# Patient Record
Sex: Male | Born: 1970 | Race: Black or African American | Hispanic: No | Marital: Married | Smoking: Never smoker
Health system: Southern US, Community
[De-identification: ages and names within clinical notes are randomized; demographics above are authoritative.]

## PROBLEM LIST (undated history)

## (undated) DIAGNOSIS — I10 Essential (primary) hypertension: Secondary | ICD-10-CM

---

## 2013-01-17 ENCOUNTER — Emergency Department: Admission: EM | Admit: 2013-01-17 | Discharge: 2013-01-17 | Payer: Self-pay | Source: Home / Self Care

## 2013-01-17 ENCOUNTER — Emergency Department
Admission: EM | Admit: 2013-01-17 | Discharge: 2013-01-17 | Disposition: A | Discharge status: Home / Self Care | Payer: PRIVATE HEALTH INSURANCE | Source: Home / Self Care | Attending: Emergency Medicine | Admitting: Emergency Medicine

## 2013-01-17 DIAGNOSIS — H01004 Unspecified blepharitis left upper eyelid: Secondary | ICD-10-CM

## 2013-01-17 DIAGNOSIS — H109 Unspecified conjunctivitis: Secondary | ICD-10-CM

## 2013-01-17 DIAGNOSIS — I10 Essential (primary) hypertension: Secondary | ICD-10-CM | POA: Insufficient documentation

## 2013-01-17 DIAGNOSIS — H01009 Unspecified blepharitis unspecified eye, unspecified eyelid: Secondary | ICD-10-CM

## 2013-01-17 HISTORY — DX: Essential (primary) hypertension: I10

## 2013-01-17 MED ORDER — POLYMYXIN B-TRIMETHOPRIM 10000-0.1 UNIT/ML-% OP SOLN: OPHTHALMIC | Status: DC

## 2013-01-17 MED ORDER — DOXYCYCLINE HYCLATE 100 MG PO CAPS: 100.0000 mg | ORAL_CAPSULE | Freq: Two times a day (BID) | ORAL | Status: DC

## 2013-01-17 NOTE — ED Notes (Signed)
Cameron Murphy complains of left eye lid swelling for 1 day. Denies eye pain, fever, chills or sweats.

## 2013-01-17 NOTE — ED Provider Notes (Signed)
History     CSN: [id number]  Arrival date & time 01/17/13  1623   First MD Initiated Contact with Patient 01/17/13 1648      Chief Complaint  Patient presents with  . Belepharitis    x 1 day     Patient is a 42 y.o. male presenting with eye problem. The history is provided by the patient.  Eye Problem Location:  L eye Quality:  Dull Severity:  Moderate Onset quality:  Gradual Duration:  1 day Timing:  Constant Progression:  Worsening Chronicity:  New Context: not burn, not chemical exposure, not contact lens problem, not direct trauma, not foreign body, not using machinery, not scratch, not smoke exposure and not tanning booth use   Relieved by:  Nothing Worsened by:  Nothing tried Ineffective treatments:  None tried Associated symptoms: discharge (Minimal left eye), inflammation, redness and swelling (Left upper eyelid)   Associated symptoms: no blurred vision, no crusting, no double vision, no facial rash, no foreign body sensation, no headaches, no itching, no nausea, no numbness, no photophobia, no scotomas, no tearing, no tingling, no vomiting and no weakness   Risk factors: no conjunctival hemorrhage, not exposed to pinkeye, no previous injury to eye, no recent herpes zoster and no recent URI     Past Medical History  Diagnosis Date  . Hypertension     History reviewed. No pertinent past surgical history.  History reviewed. No pertinent family history.  History  Substance Use Topics  . Smoking status: Never Smoker   . Smokeless tobacco: Never Used  . Alcohol Use: No      Review of Systems  Constitutional: Negative for fever and chills.  HENT: Negative for ear pain, congestion and neck pain.   Eyes: Positive for discharge (Minimal left eye) and redness. Negative for blurred vision, double vision, photophobia and itching.  Respiratory: Negative.   Cardiovascular: Negative.   Gastrointestinal: Negative.  Negative for nausea and vomiting.  Endocrine:  Negative.   Genitourinary: Negative.   Musculoskeletal: Negative.   Skin: Negative for rash.  Allergic/Immunologic: Negative.   Neurological: Negative.  Negative for tingling, weakness, numbness and headaches.  Hematological: Negative.   Psychiatric/Behavioral: Negative.     Allergies  Review of patient's allergies indicates no known allergies.  Home Medications   Current Outpatient Rx  Name  Route  Sig  Dispense  Refill  . amLODipine-benazepril (LOTREL) 10-20 MG per capsule   Oral   Take 1 capsule by mouth daily.         . chlorthalidone (HYGROTON) 25 MG tablet   Oral   Take 25 mg by mouth daily.         . sildenafil (VIAGRA) 50 MG tablet   Oral   Take 50 mg by mouth daily as needed for erectile dysfunction.         Marland Kitchen doxycycline (VIBRAMYCIN) 100 MG capsule   Oral   Take 1 capsule (100 mg total) by mouth 2 (two) times daily. Take for 10 days   20 capsule   0   . trimethoprim-polymyxin b (POLYTRIM) ophthalmic solution      2 drops in left eye Q 4-6 hours for 1 week   10 mL   0     BP 161/101  Pulse 92  Temp(Src) 98 F (36.7 C) (Oral)  Ht 6\' 1"  (1.854 m)  Wt 285 lb (129.275 kg)  BMI 37.61 kg/m2  SpO2 96%  Physical Exam  Nursing note and vitals reviewed. Constitutional: He  is oriented to person, place, and time. He appears well-developed and well-nourished. No distress.  HENT:  Head: Normocephalic and atraumatic.  Right Ear: External ear normal.  Left Ear: External ear normal.  Mouth/Throat: Oropharynx is clear and moist.  Eyes: EOM are normal. Pupils are equal, round, and reactive to light. No foreign bodies found. Right eye exhibits no chemosis, no discharge, no exudate and no hordeolum. No foreign body present in the right eye. Left eye exhibits discharge (Mild yellow serous drainage). Left eye exhibits no chemosis, no exudate and no hordeolum. No foreign body present in the left eye. Right conjunctiva is not injected. Right conjunctiva has no  hemorrhage. Left conjunctiva is injected. Left conjunctiva has no hemorrhage. No scleral icterus.  Fundoscopic exam:      The right eye shows no hemorrhage and no papilledema.       The left eye shows no hemorrhage and no papilledema.  Anterior chambers. clear bilaterally.  Left upper eyelid is red, swollen, indurated. No fluctuance or bleeding or drainage from the L eyelid.    Neck: Normal range of motion.  Cardiovascular: Normal rate.   Pulmonary/Chest: Effort normal.  Abdominal: He exhibits no distension.  Musculoskeletal: Normal range of motion.  Neurological: He is alert and oriented to person, place, and time.  Skin: Skin is warm.  Psychiatric: He has a normal mood and affect.    ED Course  Procedures (including critical care time)  Labs Reviewed - No data to display No results found.   1. Blepharitis of left upper eyelid   2. Conjunctivitis, left eye       MDM  No evidence of foreign body or abscess. RX's: By mouth doxycycline twice a day for 10 days and Polytrim eyedrops. Warm compresses. Contagiousness discussed. Red flags discussed. Followup with his eye doctor if no better in 2-3 days, sooner if worse or new symptoms. Regarding his BP of 161/101, he denies any cardiovascular symptoms. He admits that he did not take his BP meds, chlorthalidone and amlodipine this morning.-I advised him to go ahead and take his BP meds when he gets home today, and to take his BP meds daily and followup with his PCP at [facility] for BP rechecked within one to 2 weeks. Red flags discussed. He voiced understanding and agreement.        Lajean Manes, MD 01/17/13 [id number]

## 2013-01-30 ENCOUNTER — Ambulatory Visit (INDEPENDENT_AMBULATORY_CARE_PROVIDER_SITE_OTHER): Payer: PRIVATE HEALTH INSURANCE | Admitting: Family Medicine

## 2013-01-30 ENCOUNTER — Encounter: Payer: Self-pay | Admitting: Family Medicine

## 2013-01-30 VITALS — BP 152/94 | HR 90 | Ht 73.0 in | Wt 284.0 lb

## 2013-01-30 DIAGNOSIS — K219 Gastro-esophageal reflux disease without esophagitis: Secondary | ICD-10-CM | POA: Insufficient documentation

## 2013-01-30 DIAGNOSIS — I10 Essential (primary) hypertension: Secondary | ICD-10-CM

## 2013-01-30 DIAGNOSIS — E669 Obesity, unspecified: Secondary | ICD-10-CM

## 2013-01-30 DIAGNOSIS — Z13220 Encounter for screening for lipoid disorders: Secondary | ICD-10-CM

## 2013-01-30 MED ORDER — RANITIDINE HCL 150 MG PO TABS: ORAL_TABLET | ORAL | Status: DC

## 2013-01-30 NOTE — Progress Notes (Signed)
CC: Cameron Murphy is a 42 y.o. male is here for Establish Care   Subjective: HPI:  Very pleasant 42 year old here to establish care.  History of hypertension: He is taking amlodipine/benazepril and chlorthalidone on a daily basis but does admit to occasionally missing amlodipine/benazepril every once in a while. No formal exercise routine, tries to watch what he eats but doubtful he's consuming less than 2 g of sodium a day. No outside blood pressures to report.   Obesity: He reports years of trouble losing weight. Refllects on his biggest issue being diet problems. He occasionally tries to start an exercise routine but will have right knee pain 1-2 days following any time spent on a treadmill or exercise bike.    Patient complains of a sensation of regurgitating small amounts of food if she lies down flat within 20 minutes after eating a large meal. He also occasionally has a burning in the back of his throat it eating fatty or spicy meals. He denies trouble swallowing or sensation of food or liquids getting stuck in his throat. He believes his regurgitation volume is extremely small. He denies abdominal pain nor exertional chest pain. These symptoms have been present for months. Described as mild in severity very he's been taking some over-the-counter medications but he doesn't have the name and is not sure if they work too well anyway. He denies unintentional weight loss  Review of Systems - General ROS: negative for - chills, fever, night sweats, weight gain or weight loss Ophthalmic ROS: negative for - decreased vision Psychological ROS: negative for - anxiety or depression ENT ROS: negative for - hearing change, nasal congestion, tinnitus or allergies Hematological and Lymphatic ROS: negative for - bleeding problems, bruising or swollen lymph nodes Breast ROS: negative Respiratory ROS: no cough, shortness of breath, or wheezing Cardiovascular ROS: no chest pain or dyspnea on  exertion Gastrointestinal ROS: no abdominal pain, change in bowel habits, or black or bloody stools Genito-Urinary ROS: negative for - genital discharge, genital ulcers, incontinence or abnormal bleeding from genitals Musculoskeletal ROS: negative for - joint pain or muscle pain other than that described above Neurological ROS: negative for - headaches or memory loss Dermatological ROS: negative for lumps, mole changes, rash and skin lesion changes  Past Medical History  Diagnosis Date  . Hypertension      Family History  Problem Relation Age of Onset  . Hypertension Mother      History  Substance Use Topics  . Smoking status: Never Smoker   . Smokeless tobacco: Never Used  . Alcohol Use: No     Objective: Filed Vitals:   01/30/13 1517  BP: 152/94  Pulse: 90    General: Alert and Oriented, No Acute Distress HEENT: Pupils equal, round, reactive to light. Conjunctivae clear.  Pink inferior turbinates.  Moist mucous membranes, pharynx without inflammation nor lesions.  Neck supple without palpable lymphadenopathy nor abnormal masses. Lungs: Clear to auscultation bilaterally, no wheezing/ronchi/rales.  Comfortable work of breathing. Good air movement. Cardiac: Regular rate and rhythm. Normal S1/S2.  No murmurs, rubs, nor gallops.  No carotid bruits Abdomen: Obese soft and nontender Extremities: No peripheral edema.  Strong peripheral pulses.  Mental Status: No depression, anxiety, nor agitation. Skin: Warm and dry.  Assessment & Plan: Percell Boston was seen today for establish care.  Diagnoses and associated orders for this visit:  Hypertension - BASIC METABOLIC PANEL WITH GFR - TSH  Screening, lipid - Lipid panel  GERD (gastroesophageal reflux disease) - ranitidine (ZANTAC) 150  MG tablet; Take one twice a day as needed to prevent acid reflux.  Obesity (BMI 30-39.9)    Hypertension: Uncontrolled, stressed importance of trying to minimize salt intake to less than 2 g  a day. stressed importance of taking antihypertensive medications on a daily basis. Joint decision made to adjust medications if blood pressure still above pre-hypertension at upcoming CPE. Will rule out renal issues or hyperthyroidism Obesity: Discussed possibility of using Topamax to help curb appetite in the future in less blood pressure shows remarkable improvement in that case we could consider phentermine will rule out hyperthyroidism GERD: Discussed this diagnosis with the patient and the use of ranitidine on an as-needed basis or a preventative basis. Due for cholesterol panel  Return in about 4 weeks (around 02/27/2013) for CPE.

## 2013-02-05 ENCOUNTER — Ambulatory Visit: Payer: PRIVATE HEALTH INSURANCE | Admitting: Family Medicine

## 2013-02-09 ENCOUNTER — Ambulatory Visit: Payer: PRIVATE HEALTH INSURANCE | Admitting: Family Medicine

## 2013-02-11 LAB — LIPID PANEL
Cholesterol: 162 mg/dL (ref 0–200)
HDL: 39 mg/dL — ABNORMAL LOW (ref >39)
Total CHOL/HDL Ratio: 4.2 Ratio
Triglycerides: 68 mg/dL (ref <150)

## 2013-02-11 LAB — BASIC METABOLIC PANEL WITH GFR
BUN: 12 mg/dL (ref 6–23)
Calcium: 9.2 mg/dL (ref 8.4–10.5)
GFR, Est African American: >89 mL/min
Glucose, Bld: 101 mg/dL — ABNORMAL HIGH (ref 70–99)

## 2013-02-12 ENCOUNTER — Encounter: Payer: Self-pay | Admitting: Family Medicine

## 2013-02-12 DIAGNOSIS — E039 Hypothyroidism, unspecified: Secondary | ICD-10-CM | POA: Insufficient documentation

## 2013-02-12 DIAGNOSIS — E785 Hyperlipidemia, unspecified: Secondary | ICD-10-CM | POA: Insufficient documentation

## 2013-02-13 ENCOUNTER — Encounter: Payer: Self-pay | Admitting: Family Medicine

## 2013-02-13 ENCOUNTER — Ambulatory Visit (INDEPENDENT_AMBULATORY_CARE_PROVIDER_SITE_OTHER): Payer: PRIVATE HEALTH INSURANCE | Admitting: Family Medicine

## 2013-02-13 VITALS — BP 151/86 | HR 83 | Wt 285.0 lb

## 2013-02-13 DIAGNOSIS — L0390 Cellulitis, unspecified: Secondary | ICD-10-CM

## 2013-02-13 DIAGNOSIS — I10 Essential (primary) hypertension: Secondary | ICD-10-CM

## 2013-02-13 DIAGNOSIS — L0291 Cutaneous abscess, unspecified: Secondary | ICD-10-CM

## 2013-02-13 DIAGNOSIS — E039 Hypothyroidism, unspecified: Secondary | ICD-10-CM

## 2013-02-13 MED ORDER — LEVOTHYROXINE SODIUM 75 MCG PO TABS: 75.0000 ug | ORAL_TABLET | Freq: Every day | ORAL | Status: DC

## 2013-02-13 MED ORDER — MUPIROCIN 2 % EX OINT: TOPICAL_OINTMENT | CUTANEOUS | Status: DC

## 2013-02-13 NOTE — Progress Notes (Signed)
CC: Cameron Murphy is a 42 y.o. male is here for discuss labs   Subjective: HPI:  Patient follows up for elevated TSH. He reports that his work they were also doing similar labs and he was told that he may have an underactive thyroid. He's unsure of exact TSH. He does admit to trouble losing weight, unintentional weight gain, mild fatigue, all of these present for years. He denies any family history of known thyroid dysfunction. He denies any subjective goiter or swelling in the neck, no trouble swallowing, no change in voice. Denies constipation, tremor, unintentional weight loss, diarrhea, skin changes, hair loss.  Followup hypertension: Patient admits he forgot to take his blood pressure medication today. Has been taking all 3 agents on a daily basis otherwise. Denies headaches, motor sensory disturbances, chest pain, shortness of breath, orthopnea, peripheral edema, nor irregular heartbeat.  She still a little blister on his left nostril is been there ever since getting rate of a cold. Is not painful whatsoever, he is having trouble knocking off a scab by accident. Denies fevers, chills, nasal congestion  Review Of Systems Outlined In HPI  Past Medical History  Diagnosis Date  . Hypertension      Family History  Problem Relation Age of Onset  . Hypertension Mother      History  Substance Use Topics  . Smoking status: Never Smoker   . Smokeless tobacco: Never Used  . Alcohol Use: No     Objective: Filed Vitals:   02/13/13 0836  BP: 151/86  Pulse: 83    General: Alert and Oriented, No Acute Distress HEENT: Pupils equal, round, reactive to light. Conjunctivae clear.  External ears unremarkable, canals clear with intact TMs with appropriate landmarks.  Middle ear appears open without effusion. Pink inferior turbinates.  Moist mucous membranes, pharynx without inflammation nor lesions.  Neck supple without palpable lymphadenopathy nor abnormal masses. Under the left nare there is  erythema and a small blister. Lungs: Clear to auscultation bilaterally, no wheezing/ronchi/rales.  Comfortable work of breathing. Good air movement. Cardiac: Regular rate and rhythm. Normal S1/S2.  No murmurs, rubs, nor gallops.   Extremities: No peripheral edema.  Strong peripheral pulses.  Mental Status: No depression, anxiety, nor agitation. Skin: Warm and dry.  Assessment & Plan: Cameron Murphy was seen today for discuss labs.  Diagnoses and associated orders for this visit:  Hypertension  Hypothyroidism - levothyroxine (SYNTHROID) 75 MCG tablet; Take 1 tablet (75 mcg total) by mouth daily.  Cellulitis - mupirocin ointment (BACTROBAN) 2 %; Apply to affected area 3 times daily    Hypertension: Uncontrolled, continue all 3 agents, diuretic/amlodipine/benazepril. He will get his blood pressure taken by his nurse at work 3 times a week and will call me with the number to 2 weeks. Hypothyroidism: Uncontrolled, will start 75 mcg Synthroid/levothyroxine and recheck in 3 months Cellulitis: Start Bactroban for mild impetigo   Return in about 3 months (around 05/16/2013).

## 2013-02-13 NOTE — Patient Instructions (Signed)
Goal blood pressure less than 140/90

## 2013-02-26 ENCOUNTER — Encounter: Payer: Self-pay | Admitting: Family Medicine

## 2013-02-26 DIAGNOSIS — Z8619 Personal history of other infectious and parasitic diseases: Secondary | ICD-10-CM | POA: Insufficient documentation

## 2013-02-26 DIAGNOSIS — L0390 Cellulitis, unspecified: Secondary | ICD-10-CM | POA: Insufficient documentation

## 2013-02-26 DIAGNOSIS — E876 Hypokalemia: Secondary | ICD-10-CM | POA: Insufficient documentation

## 2013-04-20 ENCOUNTER — Ambulatory Visit: Payer: PRIVATE HEALTH INSURANCE | Admitting: Family Medicine

## 2013-04-24 ENCOUNTER — Ambulatory Visit (INDEPENDENT_AMBULATORY_CARE_PROVIDER_SITE_OTHER): Payer: PRIVATE HEALTH INSURANCE | Admitting: Family Medicine

## 2013-04-24 ENCOUNTER — Encounter: Payer: Self-pay | Admitting: Family Medicine

## 2013-04-24 VITALS — BP 140/85 | HR 80 | Wt 285.0 lb

## 2013-04-24 DIAGNOSIS — H6120 Impacted cerumen, unspecified ear: Secondary | ICD-10-CM

## 2013-04-24 DIAGNOSIS — I10 Essential (primary) hypertension: Secondary | ICD-10-CM

## 2013-04-24 DIAGNOSIS — E039 Hypothyroidism, unspecified: Secondary | ICD-10-CM

## 2013-04-24 DIAGNOSIS — H6121 Impacted cerumen, right ear: Secondary | ICD-10-CM

## 2013-04-24 MED ORDER — AMLODIPINE BESY-BENAZEPRIL HCL 10-20 MG PO CAPS: 1.0000 | ORAL_CAPSULE | Freq: Every day | ORAL | Status: DC

## 2013-04-24 MED ORDER — CHLORTHALIDONE 25 MG PO TABS: 25.0000 mg | ORAL_TABLET | Freq: Every day | ORAL | Status: DC

## 2013-04-24 MED ORDER — PHENTERMINE HCL 37.5 MG PO CAPS: 37.5000 mg | ORAL_CAPSULE | ORAL | Status: DC

## 2013-04-24 NOTE — Progress Notes (Signed)
CC: Cameron Murphy is a 42 y.o. male is here for Hypertension and check ear   Subjective: HPI:  Followup hypothyroidism: Has been taking 75 mcg levothyroxine daily noticing mild improvement in fatigue.  Still having trouble with weight loss despite physical activity and cutting back on calories. Denies constipation, diarrhea, tremor, depression, anxiety, skin or hair changes  Followup essential hypertension: Continues on chlorthalidone amlodipine and benazepril on a daily basis. Reports blood pressures at his office systolic 1:30-140/80s. Did not take blood pressure medication until half an hour ago before visit. Denies chest pain, shortness of breath, orthopnea, peripheral edema, headaches, lightheadedness, nor motor or sensory disturbances  Patient complains of right ear discomfort described as a fullness mild in severity with mild hearing loss. Has been present for one week. Nothing makes better or worse. Denies ringing in the ear dizziness, ear discharge.  Patient expresses frustration with trouble losing weight despite diet and exercise interventions, he would like to know if there is medications to help with weight loss    Review Of Systems Outlined In HPI  Past Medical History  Diagnosis Date  . Hypertension      Family History  Problem Relation Age of Onset  . Hypertension Mother      History  Substance Use Topics  . Smoking status: Never Smoker   . Smokeless tobacco: Never Used  . Alcohol Use: No     Objective: Filed Vitals:   04/24/13 1012  BP: 140/85  Pulse:     General: Alert and Oriented, No Acute Distress HEENT: Pupils equal, round, reactive to light. Conjunctivae clear.  Left canal unremarkable and clear however right canal with cerumen impaction easily removed with curet resulting in bilateral normal appearing tympanic membranes.  Middle ear appears open without effusion. Pink inferior turbinates.  Moist mucous membranes, pharynx without inflammation nor  lesions.  Neck supple without palpable lymphadenopathy nor abnormal masses. Lungs: Clear to auscultation bilaterally, no wheezing/ronchi/rales.  Comfortable work of breathing. Good air movement. Cardiac: Regular rate and rhythm. Normal S1/S2.  No murmurs, rubs, nor gallops.   Abdomen: Obese and soft Extremities: No peripheral edema.  Strong peripheral pulses.  Mental Status: No depression, anxiety, nor agitation. Skin: Warm and dry.  Assessment & Plan: Cameron Murphy was seen today for hypertension and check ear.  Diagnoses and associated orders for this visit:  Hypothyroidism - TSH  Hypertension - chlorthalidone (HYGROTON) 25 MG tablet; Take 1 tablet (25 mg total) by mouth daily.  Morbid obesity - Ambulatory referral to diabetic education  Cerumen impaction, right  Other Orders - phentermine 37.5 MG capsule; Take 1 capsule (37.5 mg total) by mouth every morning. - amLODipine-benazepril (LOTREL) 10-20 MG per capsule; Take 1 capsule by mouth daily.    Cerumen impaction: Cerumen in right ear was removed using soft plastic curettes and by myself using a curette. Tympanic membranes are intact following the procedure.  Auditory canals are normal.  Hearing back to normal without presenting ear pain Hypothyroidism: Clinically controlled, due for TSH check Morbid obesity: Uncontrolled, discussed phentermine which he would like to try. Return 1 month for blood pressure and weight check Hypertension: Improved and controlled, continue current regimen    Return in about 4 weeks (around 05/22/2013).

## 2013-05-18 ENCOUNTER — Ambulatory Visit: Payer: PRIVATE HEALTH INSURANCE | Admitting: Family Medicine

## 2013-06-08 ENCOUNTER — Other Ambulatory Visit: Payer: Self-pay | Admitting: *Deleted

## 2013-06-09 ENCOUNTER — Ambulatory Visit (INDEPENDENT_AMBULATORY_CARE_PROVIDER_SITE_OTHER): Payer: PRIVATE HEALTH INSURANCE | Admitting: Family Medicine

## 2013-06-09 ENCOUNTER — Encounter: Payer: Self-pay | Admitting: Family Medicine

## 2013-06-09 VITALS — BP 147/87 | HR 96 | Wt 273.0 lb

## 2013-06-09 DIAGNOSIS — E669 Obesity, unspecified: Secondary | ICD-10-CM

## 2013-06-09 DIAGNOSIS — R6882 Decreased libido: Secondary | ICD-10-CM | POA: Insufficient documentation

## 2013-06-09 DIAGNOSIS — I10 Essential (primary) hypertension: Secondary | ICD-10-CM

## 2013-06-09 MED ORDER — PHENTERMINE HCL 37.5 MG PO CAPS: 37.5000 mg | ORAL_CAPSULE | ORAL | Status: DC

## 2013-06-09 MED ORDER — CHLORTHALIDONE 25 MG PO TABS: 50.0000 mg | ORAL_TABLET | Freq: Every day | ORAL | Status: DC

## 2013-06-09 NOTE — Progress Notes (Signed)
CC: Cameron Murphy is a 42 y.o. male is here for weight and BP check   Subjective: HPI:  Followup essential hypertension: He has been taking chlorthiadone,amlodipine, benazepril on a daily basis without missed doses. He denies headaches, shortness of breath, chest pain, orthopnea, peripheral edema, motor sensory disturbances lightheadedness. He has been taking his blood pressure at his work, his nurse at work tells him that he is well-controlled but there are no specific values  Followup obesity: He has been taking phentermine on a daily basis he notices significant improvement with portion control it is easier to make healthy food substitutions. He denies irregular heartbeat, paranoia, tremor, anxiety, sleep disturbance. He is quite happy with his weight loss  Patient questions whether or not his obesity could be complicated by low testosterone. He does endorse worsening lack of libido that has been worsening over the course of a year described as moderate in severity nothing particularly makes better or worse. Is presently daily basis. He doesn't particularly feel like erectile dysfunction is an issue for him. He is still physically attracted to his wife.  Review Of Systems Outlined In HPI  Past Medical History  Diagnosis Date  . Hypertension      Family History  Problem Relation Age of Onset  . Hypertension Mother      History  Substance Use Topics  . Smoking status: Never Smoker   . Smokeless tobacco: Never Used  . Alcohol Use: No     Objective: Filed Vitals:   06/09/13 1410  BP: 147/87  Pulse: 96    General: Alert and Oriented, No Acute Distress HEENT: Pupils equal, round, reactive to light. Conjunctivae clear.  Moist mucous membranes pharynx unremarkable Lungs: Clear to auscultation bilaterally, no wheezing/ronchi/rales.  Comfortable work of breathing. Good air movement. Cardiac: Regular rate and rhythm. Normal S1/S2.  No murmurs, rubs, nor gallops.   Abdomen: Obese soft  nontender Extremities: No peripheral edema.  Strong peripheral pulses.  Mental Status: No depression, anxiety, nor agitation. Skin: Warm and dry.  Assessment & Plan: Cameron Murphy was seen today for weight and bp check.  Diagnoses and associated orders for this visit:  Hypertension - chlorthalidone (HYGROTON) 25 MG tablet; Take 2 tablets (50 mg total) by mouth daily.  Obesity (BMI 30-39.9) - phentermine 37.5 MG capsule; Take 1 capsule (37.5 mg total) by mouth every morning. - Testosterone, free, total  Low libido - Testosterone, free, total    Hypertension: Uncontrolled but not worsening since starting phentermine, I would like him to increase his chlorthalidone until we get a few more pounds off and then we will back down. I've asked him to get readings from his office nurse over the next week and drop off these studies for my review. Low libido: Will rule out hypogonadism Obesity: Improved, continue phentermine, he understands that we would have to discontinue this if blood pressures are worsening  Return in about 4 weeks (around 07/07/2013).

## 2013-06-12 ENCOUNTER — Ambulatory Visit: Payer: PRIVATE HEALTH INSURANCE | Admitting: Dietician

## 2013-07-07 ENCOUNTER — Ambulatory Visit: Payer: PRIVATE HEALTH INSURANCE | Admitting: Family Medicine

## 2013-07-07 DIAGNOSIS — Z0289 Encounter for other administrative examinations: Secondary | ICD-10-CM

## 2013-07-20 ENCOUNTER — Ambulatory Visit (INDEPENDENT_AMBULATORY_CARE_PROVIDER_SITE_OTHER): Payer: PRIVATE HEALTH INSURANCE | Admitting: Family Medicine

## 2013-07-20 ENCOUNTER — Encounter: Payer: Self-pay | Admitting: Family Medicine

## 2013-07-20 ENCOUNTER — Ambulatory Visit: Payer: PRIVATE HEALTH INSURANCE | Admitting: Family Medicine

## 2013-07-20 VITALS — BP 141/94 | HR 93 | Wt 268.0 lb

## 2013-07-20 DIAGNOSIS — I10 Essential (primary) hypertension: Secondary | ICD-10-CM

## 2013-07-20 DIAGNOSIS — E669 Obesity, unspecified: Secondary | ICD-10-CM

## 2013-07-20 MED ORDER — PHENTERMINE HCL 37.5 MG PO CAPS: 37.5000 mg | ORAL_CAPSULE | ORAL | Status: DC

## 2013-07-20 NOTE — Progress Notes (Signed)
CC: Cameron Murphy is a 42 y.o. male is here for BP and WT check   Subjective: HPI:  Followup obesity: Taking phentermine on a daily basis however occasional missed doses due to distraction a newborn in the house. Reports portion control is much more easier while on this medication additionally not snacking as much as he used to. Continues to do cardio most days of the week. He's noticed he is able to do heavy yard work much easier now since losing 18 pounds. Denies sleep disturbance, paranoia, anxiety, tremor, nor mental disturbance. Denies chest pain or irregular heartbeat  Essential hypertension: Reports blood pressure taken at work is consistently below 140/90. Denies missed doses of his blood pressure regimen. Denies motor sensory disturbances, shortness of breath    Review Of Systems Outlined In HPI  Past Medical History  Diagnosis Date  . Hypertension      Family History  Problem Relation Age of Onset  . Hypertension Mother      History  Substance Use Topics  . Smoking status: Never Smoker   . Smokeless tobacco: Never Used  . Alcohol Use: No     Objective: Filed Vitals:   07/20/13 1520  BP: 141/94  Pulse: 93    General: Alert and Oriented, No Acute Distress HEENT: Pupils equal, round, reactive to light. Conjunctivae clear.  Moist mucous membranes Lungs: Clear to auscultation bilaterally, no wheezing/ronchi/rales.  Comfortable work of breathing. Good air movement. Cardiac: Regular rate and rhythm. Normal S1/S2.  No murmurs, rubs, nor gallops.   Abdomen: Obese soft nontender Extremities: No peripheral edema.  Strong peripheral pulses.  Mental Status: No depression, anxiety, nor agitation. Skin: Warm and dry.  Assessment & Plan: Cameron Murphy was seen today for bp and wt check.  Diagnoses and associated orders for this visit:  Hypertension  Obesity (BMI 30-39.9) - phentermine 37.5 MG capsule; Take 1 capsule (37.5 mg total) by mouth every morning.    Essential  hypertension: Overall slowly improving since losing weight continue current regimen  Obesity: Improving continue phentermine provided no worsening of blood pressure with logic of blood pressure should continue to improve as weight is lost  Return in about 4 weeks (around 08/17/2013).

## 2013-08-10 ENCOUNTER — Ambulatory Visit: Payer: PRIVATE HEALTH INSURANCE | Admitting: Family Medicine

## 2013-08-10 DIAGNOSIS — Z0289 Encounter for other administrative examinations: Secondary | ICD-10-CM

## 2013-09-07 ENCOUNTER — Ambulatory Visit: Payer: PRIVATE HEALTH INSURANCE | Admitting: Family Medicine

## 2013-09-07 ENCOUNTER — Other Ambulatory Visit: Payer: Self-pay

## 2013-09-07 DIAGNOSIS — Z0289 Encounter for other administrative examinations: Secondary | ICD-10-CM

## 2013-09-07 DIAGNOSIS — I10 Essential (primary) hypertension: Secondary | ICD-10-CM

## 2013-09-08 ENCOUNTER — Ambulatory Visit: Payer: PRIVATE HEALTH INSURANCE | Admitting: Family Medicine

## 2013-09-08 DIAGNOSIS — Z0289 Encounter for other administrative examinations: Secondary | ICD-10-CM

## 2013-09-25 ENCOUNTER — Ambulatory Visit (INDEPENDENT_AMBULATORY_CARE_PROVIDER_SITE_OTHER): Payer: PRIVATE HEALTH INSURANCE | Admitting: Family Medicine

## 2013-09-25 ENCOUNTER — Encounter: Payer: Self-pay | Admitting: Family Medicine

## 2013-09-25 VITALS — BP 123/77 | HR 81 | Wt 264.0 lb

## 2013-09-25 DIAGNOSIS — Z3009 Encounter for other general counseling and advice on contraception: Secondary | ICD-10-CM

## 2013-09-25 DIAGNOSIS — E669 Obesity, unspecified: Secondary | ICD-10-CM

## 2013-09-25 MED ORDER — PHENTERMINE HCL 37.5 MG PO CAPS: 37.5000 mg | ORAL_CAPSULE | ORAL | Status: DC

## 2013-09-25 NOTE — Progress Notes (Signed)
CC: Cameron Murphy is a 42 y.o. male is here for Bp and Wt check   Subjective: HPI:  Patient complains of trouble with appetite suppression snacking throughout the day between meals. This was absent on phentermine which he stopped about a month ago to see if he still needed it. He's been able to lose 5 pounds since I last saw him increasing cardiovascular activity. He is struggling with weight loss and trying to find time to exercise everyday of the week. He would like to go back on phentermine if possible. He does not check his blood pressure at home however today it is the best it has ever been since I met him t him  He requests counseling regarding male contraception, he is considering vasectomy   Review Of Systems Outlined In HPI  Past Medical History  Diagnosis Date  . Hypertension      Family History  Problem Relation Age of Onset  . Hypertension Mother      History  Substance Use Topics  . Smoking status: Never Smoker   . Smokeless tobacco: Never Used  . Alcohol Use: No     Objective: Filed Vitals:   09/25/13 0920  BP: 123/77  Pulse: 81    General: Alert and Oriented, No Acute Distress HEENT: Pupils equal, round, reactive to light. Conjunctivae clear.  Moist mucous membranes Lungs: Clear to auscultation bilaterally, no wheezing/ronchi/rales.  Comfortable work of breathing. Good air movement. Cardiac: Regular rate and rhythm. Normal S1/S2.  No murmurs, rubs, nor gallops.   Extremities: No peripheral edema.  Strong peripheral pulses.  Mental Status: No depression, anxiety, nor agitation. Skin: Warm and dry.  Assessment & Plan: Cameron Murphy was seen today for bp and wt check.  Diagnoses and associated orders for this visit:  Encounter for contraceptive planning - Ambulatory referral to Urology  Obesity (BMI 30-39.9) - phentermine 37.5 MG capsule; Take 1 capsule (37.5 mg total) by mouth every morning.    We discussed risks and benefits along with permanence of  vasectomy, he would like referral to urology this is been placed Obesity: Uncontrolled restart phentermine return 1 month for blood pressure weight check  Return if symptoms worsen or fail to improve.

## 2013-10-12 ENCOUNTER — Encounter: Payer: Self-pay | Admitting: Family Medicine

## 2013-10-12 ENCOUNTER — Ambulatory Visit (INDEPENDENT_AMBULATORY_CARE_PROVIDER_SITE_OTHER): Payer: PRIVATE HEALTH INSURANCE | Admitting: Family Medicine

## 2013-10-12 VITALS — BP 145/91 | HR 102 | Wt 261.0 lb

## 2013-10-12 DIAGNOSIS — M5412 Radiculopathy, cervical region: Secondary | ICD-10-CM

## 2013-10-12 MED ORDER — PREDNISONE 20 MG PO TABS: ORAL_TABLET | ORAL | Status: AC

## 2013-10-12 MED ORDER — IBUPROFEN 800 MG PO TABS: 800.0000 mg | ORAL_TABLET | Freq: Three times a day (TID) | ORAL | Status: DC | PRN

## 2013-10-12 NOTE — Progress Notes (Signed)
CC: Cameron Murphy is a 42 y.o. male is here for Right shoulder and neck pain   Subjective: HPI:  Complains of a gradual onset of right-sided neck pain that was accompanied by right shoulder pain started 4 days ago without any precipitating activity or trauma. Neck pain is now gone however right shoulder pain persists. He describes it as a soreness localized near the trapezius that for one day radiated into the right arm accompanied by some mild hand numbness without any other motor or sensory disturbances. He's never had this before. Pain is relieved when lying on the right shoulder, slightly improved with ibuprofen, nothing else makes better or worse. The day that the pain began he was still able to do weight lifting with his upper extremities without inflammation the pain for better or worse.  Denies joint swelling, skin changes, cough, shortness of breath, wheezing, nor dysphagia   Review Of Systems Outlined In HPI  Past Medical History  Diagnosis Date  . Hypertension      Family History  Problem Relation Age of Onset  . Hypertension Mother      History  Substance Use Topics  . Smoking status: Never Smoker   . Smokeless tobacco: Never Used  . Alcohol Use: No     Objective: Filed Vitals:   10/12/13 1557  BP: 145/91  Pulse: 102    General: Alert and Oriented, No Acute Distress HEENT: Pupils equal, round, reactive to light. Conjunctivae clear.  Moist mucous membranes pharynx unremarkable Lungs: Clear comfortable work of breathing Extremities: No peripheral edema.  Strong peripheral pulses. Exam of the right shoulder shows a negative Hawkins, negative empty can, negative speeds, questionable crossarm being positive, full range of motion and grip strength throughout the right upper extremity. No pain with palpation of right a.c. joint. Spurling's to the right mildly reproduces symptoms. Back: No midline cervical spinous process tenderness, no hypertonicity to the trapezius  muscles Mental Status: No depression, anxiety, nor agitation. Skin: Warm and dry.  Assessment & Plan: [name] was seen today for right shoulder and neck pain.  Diagnoses and associated orders for this visit:  Cervical radiculitis - predniSONE (DELTASONE) 20 MG tablet; Three tabs at once daily for five days. - ibuprofen (ADVIL,MOTRIN) 800 MG tablet; Take 1 tablet (800 mg total) by mouth every 8 (eight) hours as needed.    Suspect cervical radiculitis, start prednisone burst and as needed ibuprofen for the next one to 2 days until prednisone kicks in. If no improvement by the end of the week return for referral to Dr. Karie Schwalbe. in sports medicine, will order cervical spine films prior to this visit if pain persists  Return if symptoms worsen or fail to improve.

## 2013-11-03 ENCOUNTER — Ambulatory Visit: Payer: PRIVATE HEALTH INSURANCE | Admitting: Family Medicine

## 2013-11-03 DIAGNOSIS — Z0289 Encounter for other administrative examinations: Secondary | ICD-10-CM

## 2013-11-12 ENCOUNTER — Other Ambulatory Visit: Payer: Self-pay | Admitting: Family Medicine

## 2013-11-17 ENCOUNTER — Ambulatory Visit: Payer: PRIVATE HEALTH INSURANCE | Admitting: Family Medicine

## 2013-11-20 ENCOUNTER — Telehealth: Payer: Self-pay | Admitting: Family Medicine

## 2013-11-20 ENCOUNTER — Telehealth: Payer: Self-pay | Admitting: *Deleted

## 2013-11-20 ENCOUNTER — Ambulatory Visit (INDEPENDENT_AMBULATORY_CARE_PROVIDER_SITE_OTHER): Payer: PRIVATE HEALTH INSURANCE | Admitting: Family Medicine

## 2013-11-20 ENCOUNTER — Encounter: Payer: Self-pay | Admitting: Family Medicine

## 2013-11-20 VITALS — BP 129/85 | HR 74 | Wt 262.0 lb

## 2013-11-20 DIAGNOSIS — N529 Male erectile dysfunction, unspecified: Secondary | ICD-10-CM

## 2013-11-20 DIAGNOSIS — I10 Essential (primary) hypertension: Secondary | ICD-10-CM

## 2013-11-20 DIAGNOSIS — E669 Obesity, unspecified: Secondary | ICD-10-CM

## 2013-11-20 DIAGNOSIS — E039 Hypothyroidism, unspecified: Secondary | ICD-10-CM

## 2013-11-20 MED ORDER — CHLORTHALIDONE 25 MG PO TABS: ORAL_TABLET | ORAL | Status: DC

## 2013-11-20 MED ORDER — TADALAFIL 5 MG PO TABS: ORAL_TABLET | ORAL | Status: DC

## 2013-11-20 MED ORDER — PHENTERMINE HCL 37.5 MG PO CAPS: 37.5000 mg | ORAL_CAPSULE | ORAL | Status: DC

## 2013-11-20 MED ORDER — AMLODIPINE BESY-BENAZEPRIL HCL 10-20 MG PO CAPS: 1.0000 | ORAL_CAPSULE | Freq: Every day | ORAL | Status: DC

## 2013-11-20 MED ORDER — TOPIRAMATE 25 MG PO TABS: 25.0000 mg | ORAL_TABLET | Freq: Every day | ORAL | Status: DC

## 2013-11-20 MED ORDER — LEVOTHYROXINE SODIUM 75 MCG PO TABS: 75.0000 ug | ORAL_TABLET | Freq: Every day | ORAL | Status: DC

## 2013-11-20 NOTE — Telephone Encounter (Signed)
Pt called and wants to know if he can get a script for a multivitamin.

## 2013-11-20 NOTE — Telephone Encounter (Signed)
See phone note that has been routed to Dr. Ivan Anchors

## 2013-11-20 NOTE — Telephone Encounter (Signed)
Pt called and would like a rx for multivitamins so he can use the rest of his money on his flex card before the end of the year

## 2013-11-20 NOTE — Telephone Encounter (Signed)
Pt called again and would like a script for multivitamin written for 90 days.  He is requesting a script so that he can use his flex card for payment.

## 2013-11-20 NOTE — Progress Notes (Signed)
CC: Cameron Murphy is a 42 y.o. male is here for Follow-up   Subjective: HPI:  Followup obesity: Has been taking phentermine off and on since I saw him in October. He reports on the days that he takes it appetite suppression is achieved. It helps with portion control. He reports subjective weight loss however has had trouble with attending followup for refills which is why he did not take it on a daily basis. Denies known side effects. Denies anxiety, mental disturbance or sleep. he is wondering if there's something else he can take to supplement the distal weight loss.   Followup hypertension: Continues on chlorthalidone and lotrel.  No outside blood pressures to report.  Denies chest pain shortness of breath orthopnea nor peripheral edema. Denies motor sensory disturbances. He does notice that he suffers from an inability to achieve an erection if he takes his Lotrel anytime after early morning. He has had success with Viagra in the past.  He's unsure what when the above side effects began however he's pretty sure it happened after one of the above antihypertensive medications was started.   followup hypothyroidism: He reports his TSH was checked at work about a month ago and was slightly high. He believes this is because of skipping multiple doses of levothyroxine in the months prior to this test. He is back to taking 75 mcg on a daily basis without missed doses over the past 2 weeks. Denies depression, anxiety, unintentional weight gain or loss, constipation or diarrhea.   Review Of Systems Outlined In HPI  Past Medical History  Diagnosis Date  . Hypertension      Family History  Problem Relation Age of Onset  . Hypertension Mother      History  Substance Use Topics  . Smoking status: Never Smoker   . Smokeless tobacco: Never Used  . Alcohol Use: No     Objective: Filed Vitals:   11/20/13 0829  BP: 129/85  Pulse: 74    General: Alert and Oriented, No Acute Distress HEENT:  Pupils equal, round, reactive to light. Conjunctivae clear.   moist mucous membranes pharynx unremarkable  Lungs: Clear to auscultation bilaterally, no wheezing/ronchi/rales.  Comfortable work of breathing. Good air movement. Cardiac: Regular rate and rhythm. Normal S1/S2.  No murmurs, rubs, nor gallops.   Abdomen:  obese soft nontender  Extremities: No peripheral edema.  Strong peripheral pulses.  Mental Status: No depression, anxiety, nor agitation. Skin: Warm and dry.  Assessment & Plan: Mekel was seen today for follow-up.  Diagnoses and associated orders for this visit:  Hypertension - amLODipine-benazepril (LOTREL) 10-20 MG per capsule; Take 1 capsule by mouth daily. - chlorthalidone (HYGROTON) 25 MG tablet; TAKE TWO TABLETS BY MOUTH DAILY  Hypothyroidism - levothyroxine (SYNTHROID) 75 MCG tablet; Take 1 tablet (75 mcg total) by mouth daily.  Obesity (BMI 30-39.9) - phentermine 37.5 MG capsule; Take 1 capsule (37.5 mg total) by mouth every morning. - topiramate (TOPAMAX) 25 MG tablet; Take 1 tablet (25 mg total) by mouth at bedtime.  Erectile dysfunction - tadalafil (CIALIS) 5 MG tablet; 1-2 tabs as needed for ED     essential hypertension: Controlled continue Lotrel and chlorthalidone, the latter of the 2 I am hopeful we can stop in the near future if he continues with successful weight loss. Suspicion that this is causing his erectile dysfunction Or possibly the ACE inhibitor Hypothyroidism: Clinically controlled now he is taking his medication on a daily basis Will recheck TSH around February Obesity: Uncontrolled continue  phentermine we'll add low-dose once a day Topamax Erectile dysfunction: Likely due to side effect of blood pressure medication, optimistic that this is temporary he continues to lose weight, samples of Cialis   Return in about 4 weeks (around 12/18/2013).

## 2013-11-23 MED ORDER — ADULT MULTIVITAMIN W/MINERALS CH: 1.0000 | ORAL_TABLET | Freq: Every day | ORAL | Status: DC

## 2013-11-23 NOTE — Telephone Encounter (Signed)
Faxed this am 

## 2013-11-23 NOTE — Telephone Encounter (Signed)
Andrea, Rx placed in in-box ready for pickup/faxing.  

## 2014-02-01 ENCOUNTER — Ambulatory Visit: Payer: PRIVATE HEALTH INSURANCE | Admitting: Family Medicine

## 2014-02-02 ENCOUNTER — Ambulatory Visit: Payer: PRIVATE HEALTH INSURANCE | Admitting: Family Medicine

## 2014-02-03 ENCOUNTER — Ambulatory Visit (INDEPENDENT_AMBULATORY_CARE_PROVIDER_SITE_OTHER): Payer: PRIVATE HEALTH INSURANCE | Admitting: Family Medicine

## 2014-02-03 ENCOUNTER — Encounter: Payer: Self-pay | Admitting: Family Medicine

## 2014-02-03 VITALS — BP 139/93 | HR 87 | Wt 267.0 lb

## 2014-02-03 DIAGNOSIS — I10 Essential (primary) hypertension: Secondary | ICD-10-CM

## 2014-02-03 DIAGNOSIS — E669 Obesity, unspecified: Secondary | ICD-10-CM

## 2014-02-03 DIAGNOSIS — E039 Hypothyroidism, unspecified: Secondary | ICD-10-CM

## 2014-02-03 MED ORDER — CHLORTHALIDONE 25 MG PO TABS: ORAL_TABLET | ORAL | Status: DC

## 2014-02-03 NOTE — Progress Notes (Signed)
CC: Cameron Murphy is a 43 y.o.Cameron Murphy male is here for Follow-up   Subjective: HPI:  Followup obesity: Was taking phentermine on a daily basis up until the middle of January until the prescription ran out. He believes he got his weight down 255 while taking that. He was also taking Topamax twice a day but after phentermine ran out did not notice any appetite suppression so stopped this medication. He is currently working out most days of the week with strength training he believes he is building muscle mass not burning much fat. He tries to watch what he eats sticking to a low fat diet.  Followup essential hypertension: Continues on Lotrel and chlorthalidone with no outside blood pressures to report. Denies chest pain shortness of breath orthopnea nor peripheral edema nor motor or sensory disturbances. Denies cramping. Denies and side effects of medication regimen  Followup hypothyroidism: Continues to take levothyroxine 75 mcg on a daily basis without missed doses since I saw him last. Denies anxiety, depression, GI disturbance   Review Of Systems Outlined In HPI  Past Medical History  Diagnosis Date  . Hypertension     No past surgical history on file. Family History  Problem Relation Age of Onset  . Hypertension Mother     History   Social History  . Marital Status: Married    Spouse Name: N/A    Number of Children: N/A  . Years of Education: N/A   Occupational History  . Not on file.   Social History Main Topics  . Smoking status: Never Smoker   . Smokeless tobacco: Never Used  . Alcohol Use: No  . Drug Use: No  . Sexual Activity: Yes   Other Topics Concern  . Not on file   Social History Narrative  . No narrative on file     Objective: BP 139/93  Pulse 87  Wt 267 lb (121.11 kg)  General: Alert and Oriented, No Acute Distress HEENT: Pupils equal, round, reactive to light. Conjunctivae clear. Moist membranes pharynx unremarkable Lungs: Clear to auscultation  bilaterally, no wheezing/ronchi/rales.  Comfortable work of breathing. Good air movement. Cardiac: Regular rate and rhythm. Normal S1/S2.  No murmurs, rubs, nor gallops.   Abdomen: Obese soft nontender Extremities: No peripheral edema.  Strong peripheral pulses.  Mental Status: No depression, anxiety, nor agitation. Skin: Warm and dry.  Assessment & Plan: Cameron AlimentMarlon was seen today for follow-up.  Diagnoses and associated orders for this visit:  Hypertension - BASIC METABOLIC PANEL WITH GFR - chlorthalidone (HYGROTON) 25 MG tablet; TAKE TWO TABLETS BY MOUTH DAILY  Hypothyroidism - TSH  Obesity (BMI 30-39.9)    Hypertension: Uncontrolled he has been successful with bringing this back into the normotensive/prehypertensive range with weight loss alone therefore we will focus on this given that his elevation is not that drastic today. See below for checking metabolic panel refills provided Hypothyroidism: Rechecking TSH now that he has been take levothyroxine on a daily basis Obesity: Uncontrolled discussed that phentermine is no longer indicated given that he is gaining weight. He is not interested in [facility].  We discussed focusing on aerobic activity 30-45 minutes daily to help focus on burning fat instead of gaining muscle mass this should also help with lowering blood pressure   Return in about 3 months (around 05/06/2014).

## 2014-02-04 LAB — BASIC METABOLIC PANEL WITH GFR
BUN: 11 mg/dL (ref 6–23)
CALCIUM: 9.4 mg/dL (ref 8.4–10.5)
CO2: 27 mEq/L (ref 19–32)
Chloride: 105 mEq/L (ref 96–112)
Creat: 1.04 mg/dL (ref 0.50–1.35)
GFR, EST NON AFRICAN AMERICAN: 88 mL/min
GLUCOSE: 85 mg/dL (ref 70–99)
POTASSIUM: 3.9 meq/L (ref 3.5–5.3)
SODIUM: 142 meq/L (ref 135–145)

## 2014-02-04 LAB — TSH: TSH: 3.935 u[IU]/mL (ref 0.350–4.500)

## 2014-04-16 ENCOUNTER — Ambulatory Visit (INDEPENDENT_AMBULATORY_CARE_PROVIDER_SITE_OTHER): Payer: PRIVATE HEALTH INSURANCE

## 2014-04-16 ENCOUNTER — Ambulatory Visit (INDEPENDENT_AMBULATORY_CARE_PROVIDER_SITE_OTHER): Payer: PRIVATE HEALTH INSURANCE | Admitting: Family Medicine

## 2014-04-16 ENCOUNTER — Encounter: Payer: Self-pay | Admitting: Family Medicine

## 2014-04-16 VITALS — BP 133/86 | HR 90 | Wt 269.0 lb

## 2014-04-16 DIAGNOSIS — M79672 Pain in left foot: Secondary | ICD-10-CM

## 2014-04-16 DIAGNOSIS — M79609 Pain in unspecified limb: Secondary | ICD-10-CM

## 2014-04-16 DIAGNOSIS — J309 Allergic rhinitis, unspecified: Secondary | ICD-10-CM

## 2014-04-16 DIAGNOSIS — I10 Essential (primary) hypertension: Secondary | ICD-10-CM

## 2014-04-16 DIAGNOSIS — J302 Other seasonal allergic rhinitis: Secondary | ICD-10-CM

## 2014-04-16 DIAGNOSIS — E669 Obesity, unspecified: Secondary | ICD-10-CM

## 2014-04-16 MED ORDER — MELOXICAM 15 MG PO TABS: 15.0000 mg | ORAL_TABLET | Freq: Every day | ORAL | Status: DC

## 2014-04-16 MED ORDER — MONTELUKAST SODIUM 10 MG PO TABS: 10.0000 mg | ORAL_TABLET | Freq: Every day | ORAL | Status: DC

## 2014-04-16 MED ORDER — PHENTERMINE HCL 37.5 MG PO TABS: 37.5000 mg | ORAL_TABLET | Freq: Every day | ORAL | Status: DC

## 2014-04-16 NOTE — Progress Notes (Signed)
CC: Flossie DibbleMarlon Murphy is a 43 y.o. male is here for Hypertension   Subjective: HPI:  Complains of nasal congestion that has been present for 2 weeks. Symptoms are moderate in severity present all hours of the day, clear discharge, accompanied by subjective postnasal drip and nonproductive cough. Symptoms are worse in the evening when lying down flat. Symptoms were predictably occurring every time he would move along however over the past 2 weeks have been persistent. No benefit with Claritin. No other interventions. Denies shortness of breath, wheezing, fevers, chills or facial pain  Complains of left foot pain that has been present for the past one to 2 months. Worse when running or walking fast. Localized to the anterior ankle nonradiating. Accompanied by pain in the center and medial foot both dorsal and plantar surface of current and the same situation as above. Pain is mild in severity and nonradiating. Improves with nonweightbearing situations. Denies swelling, redness, overlying skin changes, nor motor or sensory disturbances  Followup obesity: Continues to have difficulty with portion control and losing weight despite exercising most days of the week.  Followup hypertension: Continues on Lotrel, chlorthalidone with no outside blood pressures to report denies chest pain shortness of breath orthopnea nor peripheral edema  Review Of Systems Outlined In HPI  Past Medical History  Diagnosis Date  . Hypertension     No past surgical history on file. Family History  Problem Relation Age of Onset  . Hypertension Mother     History   Social History  . Marital Status: Married    Spouse Name: N/A    Number of Children: N/A  . Years of Education: N/A   Occupational History  . Not on file.   Social History Main Topics  . Smoking status: Never Smoker   . Smokeless tobacco: Never Used  . Alcohol Use: No  . Drug Use: No  . Sexual Activity: Yes   Other Topics Concern  . Not on file    Social History Narrative  . No narrative on file     Objective: BP 133/86  Pulse 90  Wt 269 lb (122.018 kg)  General: Alert and Oriented, No Acute Distress HEENT: Pupils equal, round, reactive to light. Conjunctivae clear.  External ears unremarkable, canals clear with intact TMs with appropriate landmarks.  Middle ear appears open without effusion. Mildly erythematous and care turbinates with moderate mucoid discharge.  Moist mucous membranes, pharynx without inflammation nor lesions.  Neck supple without palpable lymphadenopathy nor abnormal masses. Lungs: Clear to auscultation bilaterally, no wheezing/ronchi/rales.  Comfortable work of breathing. Good air movement. Cardiac: Regular rate and rhythm. Normal S1/S2.  No murmurs, rubs, nor gallops.   Extremities: No peripheral edema.  Strong peripheral pulses. Exam of the left foot shows no pain over lateral or medial malleoli, base of fifth metatarsal, navicular but pain is reproduced just proximal to the base of first metatarsal. Mental Status: No depression, anxiety, nor agitation. Skin: Warm and dry.  Assessment & Plan: [name] was seen today for hypertension.  Diagnoses and associated orders for this visit:  Hypertension  Left foot pain - meloxicam (MOBIC) 15 MG tablet; Take 1 tablet (15 mg total) by mouth daily. - DG Foot Complete Left; Future  Seasonal allergies - montelukast (SINGULAIR) 10 MG tablet; Take 1 tablet (10 mg total) by mouth at bedtime.  Obesity - phentermine (ADIPEX-P) 37.5 MG tablet; Take 1 tablet (37.5 mg total) by mouth daily before breakfast.    Hypertension: Controlled continue chlorthalidone and Lotrel Seasonal  allergies: Start Zyrtec if not improving in one week fill prescription for Singulair Left foot pain: Obtain x-ray to evaluate for candidacy of steroid injections Obesity: Uncontrolled, will restart phentermine to see if this provides any new weight loss benefit  Return in about 4 weeks  (around 05/14/2014).

## 2014-05-09 ENCOUNTER — Other Ambulatory Visit: Payer: Self-pay | Admitting: Family Medicine

## 2014-06-10 ENCOUNTER — Encounter: Payer: Self-pay | Admitting: Family Medicine

## 2014-06-10 ENCOUNTER — Ambulatory Visit (INDEPENDENT_AMBULATORY_CARE_PROVIDER_SITE_OTHER): Payer: PRIVATE HEALTH INSURANCE | Admitting: Family Medicine

## 2014-06-10 VITALS — BP 138/97 | HR 88 | Ht 73.0 in | Wt 273.0 lb

## 2014-06-10 DIAGNOSIS — I10 Essential (primary) hypertension: Secondary | ICD-10-CM

## 2014-06-10 DIAGNOSIS — N529 Male erectile dysfunction, unspecified: Secondary | ICD-10-CM

## 2014-06-10 DIAGNOSIS — E669 Obesity, unspecified: Secondary | ICD-10-CM

## 2014-06-10 MED ORDER — PHENTERMINE HCL 37.5 MG PO TABS: 37.5000 mg | ORAL_TABLET | Freq: Every day | ORAL | Status: DC

## 2014-06-10 MED ORDER — TADALAFIL 20 MG PO TABS: 10.0000 mg | ORAL_TABLET | ORAL | Status: DC | PRN

## 2014-06-10 NOTE — Progress Notes (Signed)
CC: Cameron Murphy is a 43 y.o. male is here for BP and Wt check   Subjective: HPI:  Followup obesity: Since I saw him last he was taking phentermine on a daily basis up until 3 weeks ago when he ran out of his prescription. He tells me while he was taking this he had great improvement with appetite suppression, and that he had lost 7-8 pounds but unfortunately after stopping the medication regained the weight and started slacking on aerobic activity. He denies any sleep disturbance, anxiety, paranoia, chest pain. He would like to restart taking phentermine if possible.    followup essential hypertension: No outside blood pressures to report did not take blood pressure medication today.  Currently working out 1-2 times a week. Denies chest pain, shortness of breath, orthopnea nor peripheral edema  He is requesting another sample of Cialis, the samples that he was given at a prior visit were lost. He has not taken Cialis before. He continues to describe moderate difficulty with initiating and maintaining an erection without any genitourinary complaints other than that   Review Of Systems Outlined In HPI  Past Medical History  Diagnosis Date  . Hypertension     No past surgical history on file. Family History  Problem Relation Age of Onset  . Hypertension Mother     History   Social History  . Marital Status: Married    Spouse Name: N/A    Number of Children: N/A  . Years of Education: N/A   Occupational History  . Not on file.   Social History Main Topics  . Smoking status: Never Smoker   . Smokeless tobacco: Never Used  . Alcohol Use: No  . Drug Use: No  . Sexual Activity: Yes   Other Topics Concern  . Not on file   Social History Narrative  . No narrative on file     Objective: BP 138/97  Pulse 88  Ht 6\' 1"  (1.854 m)  Wt 273 lb (123.832 kg)  BMI 36.03 kg/m2  General: Alert and Oriented, No Acute Distress HEENT: Pupils equal, round, reactive to light.  Conjunctivae clear.   moist mucous membranes pharynx unremarkable Lungs: Clear to auscultation bilaterally, no wheezing/ronchi/rales.  Comfortable work of breathing. Good air movement. Cardiac: Regular rate and rhythm. Normal S1/S2.  No murmurs, rubs, nor gallops.   Extremities: No peripheral edema.  Strong peripheral pulses.  Mental Status: No depression, anxiety, nor agitation. Skin: Warm and dry.  Assessment & Plan: Cameron Murphy was seen today for bp and wt check.  Diagnoses and associated orders for this visit:  Obesity (BMI 30-39.9)  Essential hypertension  Obesity - phentermine (ADIPEX-P) 37.5 MG tablet; Take 1 tablet (37.5 mg total) by mouth daily before breakfast.  Erectile dysfunction, unspecified erectile dysfunction type  Other Orders - tadalafil (CIALIS) 20 MG tablet; Take 0.5-1 tablets (10-20 mg total) by mouth every other day as needed for erectile dysfunction.     obesity: Uncontrolled restart phentermine, stressed the importance of taking this without breaks and recommended that he followup days before he plans on running out of the medication in the future for refill considerations.   essential hypertension: Encouraged to take blood pressure as prescribed on a daily basis, drop off one week blood pressure log next week after starting on phentermine to ensure that blood pressures still controlled Erectile dysfunction: Uncontrolled, provided with a replacement sample pack for Cialis  Return in about 4 weeks (around 07/08/2014) for Nurse visit blood pressure weight check.

## 2014-07-12 ENCOUNTER — Other Ambulatory Visit: Payer: Self-pay | Admitting: Family Medicine

## 2014-08-02 ENCOUNTER — Ambulatory Visit: Payer: PRIVATE HEALTH INSURANCE | Admitting: Family Medicine

## 2014-08-13 ENCOUNTER — Encounter: Payer: Self-pay | Admitting: Family Medicine

## 2014-08-13 ENCOUNTER — Ambulatory Visit (INDEPENDENT_AMBULATORY_CARE_PROVIDER_SITE_OTHER): Payer: PRIVATE HEALTH INSURANCE | Admitting: Family Medicine

## 2014-08-13 VITALS — BP 128/82 | HR 96 | Wt 269.0 lb

## 2014-08-13 DIAGNOSIS — I10 Essential (primary) hypertension: Secondary | ICD-10-CM

## 2014-08-13 DIAGNOSIS — R635 Abnormal weight gain: Secondary | ICD-10-CM

## 2014-08-13 DIAGNOSIS — E669 Obesity, unspecified: Secondary | ICD-10-CM

## 2014-08-13 MED ORDER — CHLORTHALIDONE 25 MG PO TABS: ORAL_TABLET | ORAL | Status: DC

## 2014-08-13 MED ORDER — AMLODIPINE BESY-BENAZEPRIL HCL 10-20 MG PO CAPS: 1.0000 | ORAL_CAPSULE | Freq: Every day | ORAL | Status: DC

## 2014-08-13 MED ORDER — PHENTERMINE HCL 37.5 MG PO TABS: 37.5000 mg | ORAL_TABLET | Freq: Every day | ORAL | Status: DC

## 2014-08-13 NOTE — Progress Notes (Signed)
CC: Cameron Murphy is a 43 y.o. male is here for wt check and BP check   Subjective: HPI:  Recently had a new baby girl, healthy, named Cameron Murphy  Followup obesity: Continues to take vitamin on a daily basis without noted side effects but he still notices effect with appetite suppression. He goes to the gym about 3 times a week.  He reports subjective weight loss of unknown amount. Denies chest pain, irregular heartbeat, nor mental disturbance will take the medication.   Followup essential hypertension: Since I saw him last he's back to taking his Lotrel and chlorthalidone on a daily basis without missed doses. All blood pressures outside of our office have been in the normotensive range now. Denies chest pain shortness of breath orthopnea peripheral edema nor motor or sensory disturbances        Review Of Systems Outlined In HPI  Past Medical History  Diagnosis Date  . Hypertension     No past surgical history on file. Family History  Problem Relation Age of Onset  . Hypertension Mother     History   Social History  . Marital Status: Married    Spouse Name: N/A    Number of Children: N/A  . Years of Education: N/A   Occupational History  . Not on file.   Social History Main Topics  . Smoking status: Never Smoker   . Smokeless tobacco: Never Used  . Alcohol Use: No  . Drug Use: No  . Sexual Activity: Yes   Other Topics Concern  . Not on file   Social History Narrative  . No narrative on file     Objective: BP 128/82  Pulse 96  Wt 269 lb (122.018 kg)  General: Alert and Oriented, No Acute Distress HEENT: Pupils equal, round, reactive to light. Conjunctivae clear.  External ears unremarkable, canals clear with intact TMs with appropriate landmarks.  Middle ear appears open without effusion. Pink inferior turbinates.  Moist mucous membranes, pharynx without inflammation nor lesions.  Neck supple without palpable lymphadenopathy nor abnormal masses. Lungs: Clear to  auscultation bilaterally, no wheezing/ronchi/rales.  Comfortable work of breathing. Good air movement. Cardiac: Regular rate and rhythm. Normal S1/S2.  No murmurs, rubs, nor gallops.   Abdomen: Normal bowel sounds, soft and non tender without palpable masses. Extremities: No peripheral edema.  Strong peripheral pulses.  Mental Status: No depression, anxiety, nor agitation. Skin: Warm and dry.  Assessment & Plan: Nur was seen today for wt check and bp check.  Diagnoses and associated orders for this visit:  Abnormal weight gain  Morbid obesity  Obesity - phentermine (ADIPEX-P) 37.5 MG tablet; Take 1 tablet (37.5 mg total) by mouth daily before breakfast.  Essential hypertension - amLODipine-benazepril (LOTREL) 10-20 MG per capsule; Take 1 capsule by mouth daily. - chlorthalidone (HYGROTON) 25 MG tablet; TAKE TWO TABLETS BY MOUTH DAILY     obesity: Improving continue phentermine and urged to exercise and most days of the week   essential hypertension: Controlled continue current antihypertensives above.  He received a flu shot today  Return in about 4 weeks (around 09/10/2014) for BP/Weight Check.

## 2014-09-06 ENCOUNTER — Encounter: Payer: Self-pay | Admitting: Family Medicine

## 2014-09-06 ENCOUNTER — Ambulatory Visit (INDEPENDENT_AMBULATORY_CARE_PROVIDER_SITE_OTHER): Payer: PRIVATE HEALTH INSURANCE | Admitting: Family Medicine

## 2014-09-06 VITALS — BP 142/94 | HR 89 | Temp 98.2°F | Ht 73.0 in | Wt 275.0 lb

## 2014-09-06 DIAGNOSIS — I10 Essential (primary) hypertension: Secondary | ICD-10-CM

## 2014-09-06 DIAGNOSIS — L0390 Cellulitis, unspecified: Secondary | ICD-10-CM

## 2014-09-06 MED ORDER — AMLODIPINE BESY-BENAZEPRIL HCL 10-20 MG PO CAPS: 1.0000 | ORAL_CAPSULE | Freq: Every day | ORAL | Status: DC

## 2014-09-06 MED ORDER — CHLORTHALIDONE 25 MG PO TABS: ORAL_TABLET | ORAL | Status: DC

## 2014-09-06 MED ORDER — CLINDAMYCIN HCL 300 MG PO CAPS: 300.0000 mg | ORAL_CAPSULE | Freq: Three times a day (TID) | ORAL | Status: DC

## 2014-09-06 NOTE — Progress Notes (Signed)
CC: Cameron Murphy is a 43 y.o. male is here for Mass   Subjective: HPI:  Pain and swelling underneath the left axilla that has been present for the past 3-4 days. This proceeded by a similar looking issue 2 weeks ago however this resolved without any intervention. Pain is mild to moderate in severity and swelling seems to be worsening on a daily basis. No interventions as of yet. He reports mild body aches but no other pain.  Has had some worsening redness around the site of swelling.  Denies pain with moving his left upper extremity, breathing nor any chest pain.  Denies fevers, chills, nausea, flushing nor skin lesions elsewhere   Review Of Systems Outlined In HPI  Past Medical History  Diagnosis Date  . Hypertension     No past surgical history on file. Family History  Problem Relation Age of Onset  . Hypertension Mother     History   Social History  . Marital Status: Married    Spouse Name: N/A    Number of Children: N/A  . Years of Education: N/A   Occupational History  . Not on file.   Social History Main Topics  . Smoking status: Never Smoker   . Smokeless tobacco: Never Used  . Alcohol Use: No  . Drug Use: No  . Sexual Activity: Yes   Other Topics Concern  . Not on file   Social History Narrative  . No narrative on file     Objective: BP 142/94  Pulse 89  Temp(Src) 98.2 F (36.8 C)  Ht 6\' 1"  (1.854 m)  Wt 275 lb (124.739 kg)  BMI 36.29 kg/m2  Vital signs reviewed. General: Alert and Oriented, No Acute Distress HEENT: Pupils equal, round, reactive to light. Conjunctivae clear.  External ears unremarkable.  Moist mucous membranes. Lungs: Clear and comfortable work of breathing, speaking in full sentences without accessory muscle use. Cardiac: Regular rate and rhythm.  Extremities: No peripheral edema.  Strong peripheral pulses.  Mental Status: No depression, anxiety, nor agitation. Logical though process. Skin: Warm and dry. 2 handbreadths beneath the  left axilla there is a 2 cm diameter region of induration and erythema with a central black pustule, slightly tender to the touch and warm to the touch.  When evaluated with a bedside ultrasound there are no hypoechoic structures underlying the site of induration  Assessment & Plan: Cameron Murphy was seen today for mass.  Diagnoses and associated orders for this visit:  Cellulitis, unspecified cellulitis site, unspecified extremity site, unspecified laterality - clindamycin (CLEOCIN) 300 MG capsule; Take 1 capsule (300 mg total) by mouth 3 (three) times daily.    Cellulitis: Start clindamycin consider warm compresses 15 minutes 3 times a day to help further prevent creation of an abscess. The area of redness and induration was outlined with a surgical marker and I've asked him to call me if any of the redness extends beyond this parameter on Thursday morning despite starting antibiotics today.  There is some sort of issue with him getting his Lotrel and and chlorthalidone, resending today. He ran out 4 days ago.  Return if symptoms worsen or fail to improve.

## 2014-10-11 ENCOUNTER — Other Ambulatory Visit: Payer: Self-pay | Admitting: Family Medicine

## 2014-11-03 ENCOUNTER — Ambulatory Visit: Payer: PRIVATE HEALTH INSURANCE | Admitting: Family Medicine

## 2014-11-04 ENCOUNTER — Encounter: Payer: Self-pay | Admitting: Family Medicine

## 2014-11-04 ENCOUNTER — Ambulatory Visit (INDEPENDENT_AMBULATORY_CARE_PROVIDER_SITE_OTHER): Payer: PRIVATE HEALTH INSURANCE | Admitting: Family Medicine

## 2014-11-04 VITALS — BP 124/76 | HR 97 | Ht 73.0 in | Wt 278.0 lb

## 2014-11-04 DIAGNOSIS — N529 Male erectile dysfunction, unspecified: Secondary | ICD-10-CM

## 2014-11-04 DIAGNOSIS — E669 Obesity, unspecified: Secondary | ICD-10-CM

## 2014-11-04 DIAGNOSIS — E039 Hypothyroidism, unspecified: Secondary | ICD-10-CM

## 2014-11-04 DIAGNOSIS — I10 Essential (primary) hypertension: Secondary | ICD-10-CM

## 2014-11-04 DIAGNOSIS — H109 Unspecified conjunctivitis: Secondary | ICD-10-CM

## 2014-11-04 LAB — TSH: TSH: 4.25 u[IU]/mL (ref 0.350–4.500)

## 2014-11-04 MED ORDER — CHLORTHALIDONE 25 MG PO TABS: ORAL_TABLET | ORAL | Status: DC

## 2014-11-04 MED ORDER — OLOPATADINE HCL 0.1 % OP SOLN: 1.0000 [drp] | Freq: Two times a day (BID) | OPHTHALMIC | Status: DC | PRN

## 2014-11-04 MED ORDER — PHENTERMINE HCL 37.5 MG PO TABS: 37.5000 mg | ORAL_TABLET | Freq: Every day | ORAL | Status: DC

## 2014-11-04 MED ORDER — LEVOTHYROXINE SODIUM 75 MCG PO TABS: 75.0000 ug | ORAL_TABLET | Freq: Every day | ORAL | Status: DC

## 2014-11-04 NOTE — Progress Notes (Signed)
CC: Cameron Murphy is a 43 y.o. male is here for Follow-up   Subjective: HPI:  Follow-up obesity: He has not been taking phentermine for over 3 weeks now due to running out of the medication. He believes that it is providing him help with portion control and weight loss when taking the medication. He is having trouble getting to the gym 3 days a week. He is trying to watch his diet  Follow-up essential hypertension: No outside blood pressures report. Continues on chlorthalidone and Lotrel on a daily basis. No known side effects. Denies lightheadedness chest pain shortness of breath orthopnea nor peripheral edema  Follow-up erectile dysfunction: Symptoms seem to be worsened since taking any blood pressure medication. Has been benefited by Cialis. Symptoms are currently moderate in severity. He wants to know if testosterone could be deficient and causing some of his symptoms.  Complains of eye redness every morning for at least a few hours after awakening. He denies dry eyes or itching nor any known discharge. It involves both eyes equally and mild to moderate in severity. He's tried over-the-counter eyedrops that are only mildly beneficial. He denies vision loss or any eye pain.   Review Of Systems Outlined In HPI  Past Medical History  Diagnosis Date  . Hypertension     No past surgical history on file. Family History  Problem Relation Age of Onset  . Hypertension Mother     History   Social History  . Marital Status: Married    Spouse Name: N/A    Number of Children: N/A  . Years of Education: N/A   Occupational History  . Not on file.   Social History Main Topics  . Smoking status: Never Smoker   . Smokeless tobacco: Never Used  . Alcohol Use: No  . Drug Use: No  . Sexual Activity: Yes   Other Topics Concern  . Not on file   Social History Narrative     Objective: BP 124/76 mmHg  Pulse 97  Ht 6\' 1"  (1.854 m)  Wt 278 lb (126.1 kg)  BMI 36.69 kg/m2  General:  Alert and Oriented, No Acute Distress HEENT: Pupils equal, round, reactive to light. Conjunctivae clear.  External ears unremarkable, canals clear with intact TMs with appropriate landmarks.  Middle ear appears open without effusion. Pink inferior turbinates.  Moist mucous membranes, pharynx without inflammation nor lesions.  Neck supple without palpable lymphadenopathy nor abnormal masses. Lungs: Clear to auscultation bilaterally, no wheezing/ronchi/rales.  Comfortable work of breathing. Good air movement. Cardiac: Regular rate and rhythm. Normal S1/S2.  No murmurs, rubs, nor gallops.   Abdomen: obese and soft Extremities: No peripheral edema.  Strong peripheral pulses.  Mental Status: No depression, anxiety, nor agitation. Skin: Warm and dry.  Assessment & Plan: Cameron Murphy was seen today for follow-up.  Diagnoses and associated orders for this visit:  Essential hypertension - chlorthalidone (HYGROTON) 25 MG tablet; TAKE TWO TABLETS BY MOUTH DAILY  Obesity - phentermine (ADIPEX-P) 37.5 MG tablet; Take 1 tablet (37.5 mg total) by mouth daily before breakfast. - Testosterone  Hypothyroidism, unspecified hypothyroidism type - TSH  Bilateral conjunctivitis - olopatadine (PATANOL) 0.1 % ophthalmic solution; Place 1 drop into both eyes 2 (two) times daily as needed (red eyes).  Erectile dysfunction, unspecified erectile dysfunction type - Testosterone  Other Orders - levothyroxine (SYNTHROID, LEVOTHROID) 75 MCG tablet; Take 1 tablet (75 mcg total) by mouth daily.    Essential hypertension: Controlled continue chlorthalidone and Lotrel Obesity: Restarted phentermine and discussed that  this is best used if he does not have any lapse between refills, stressed the importance of working out regularly Hypothyroidism: Checking TSH to see if this is contributing to difficulty losing weight, pending results continue levothyroxine 75 g Bilateral conjunctivitis: Trial of Patanol due to suspicion  of allergic conjunctivitis Erectile dysfunction: Checking testosterone  Return in about 4 weeks (around 12/02/2014) for BP Weight Check.

## 2014-11-05 ENCOUNTER — Telehealth: Payer: Self-pay | Admitting: Family Medicine

## 2014-11-05 ENCOUNTER — Encounter: Payer: Self-pay | Admitting: Family Medicine

## 2014-11-05 DIAGNOSIS — E291 Testicular hypofunction: Secondary | ICD-10-CM

## 2014-11-05 LAB — TESTOSTERONE: TESTOSTERONE: 225 ng/dL — AB (ref 300–890)

## 2014-11-05 NOTE — Telephone Encounter (Signed)
Cameron MuirJamie, Will you please let patient know that his testosterone was in the deficient range.  If he's interested in starting testosterone supplementation therapy the next step would be to recheck testosterone in one week along with a PSA prostate blood test and a hemoglobin check.  I'll place lab slips for this in your inbox if he's interested.  Thyroid function was normal

## 2014-11-09 NOTE — Telephone Encounter (Signed)
Left voicemail to return call regarding lab results. Corliss SkainsJamie Anastasiya Gowin, CMA

## 2014-11-29 NOTE — Telephone Encounter (Signed)
Patient advised and he will come in the morning to have labs done.

## 2014-11-30 ENCOUNTER — Ambulatory Visit: Payer: PRIVATE HEALTH INSURANCE | Admitting: Family Medicine

## 2014-11-30 LAB — HEMOGLOBIN: Hemoglobin: 15.2 g/dL (ref 13.0–17.0)

## 2014-12-01 LAB — TESTOSTERONE: TESTOSTERONE: 208.9 ng/dL — AB (ref 300–890)

## 2014-12-01 LAB — PSA: PSA: 0.9 ng/mL (ref <=4.00)

## 2014-12-02 ENCOUNTER — Ambulatory Visit (INDEPENDENT_AMBULATORY_CARE_PROVIDER_SITE_OTHER): Payer: PRIVATE HEALTH INSURANCE | Admitting: Family Medicine

## 2014-12-02 ENCOUNTER — Encounter: Payer: Self-pay | Admitting: Family Medicine

## 2014-12-02 VITALS — BP 146/97 | HR 78 | Wt 285.0 lb

## 2014-12-02 DIAGNOSIS — E291 Testicular hypofunction: Secondary | ICD-10-CM

## 2014-12-02 MED ORDER — TESTOSTERONE CYPIONATE 200 MG/ML IM SOLN
300.0000 mg | Freq: Once | INTRAMUSCULAR | Status: AC
  Administered 2014-12-02: 300 mg via INTRAMUSCULAR

## 2014-12-02 NOTE — Progress Notes (Signed)
CC: Cameron DibbleMarlon Murphy is a 43 y.o. male is here for discuss testosterone replacement therapy   Subjective: HPI:  Patient presents to discuss risks and benefits of testosterone supplementation. He had a normal PSA, normal hemoglobin and testosterone level that was in the 200s twice. He endorses difficulty initiating and maintaining an erection, decreased libido, and fatigue all of which has been present on a daily basis for almost a year now. Erectile dysfunction slightly improved with phosphodiesterase inhibitors in the past. Nothing has helped libido. Symptoms overall are moderate in severity. He has no known coronary artery disease. He's never had a heart attack.   Review Of Systems Outlined In HPI  Past Medical History  Diagnosis Date  . Hypertension     No past surgical history on file. Family History  Problem Relation Age of Onset  . Hypertension Mother     History   Social History  . Marital Status: Married    Spouse Name: N/A    Number of Children: N/A  . Years of Education: N/A   Occupational History  . Not on file.   Social History Main Topics  . Smoking status: Never Smoker   . Smokeless tobacco: Never Used  . Alcohol Use: No  . Drug Use: No  . Sexual Activity: Yes   Other Topics Concern  . Not on file   Social History Narrative     Objective: BP 146/97 mmHg  Pulse 78  Wt 285 lb (129.275 kg)  Vital signs reviewed. General: Alert and Oriented, No Acute Distress HEENT: Pupils equal, round, reactive to light. Conjunctivae clear.  External ears unremarkable.  Moist mucous membranes. Lungs: Clear and comfortable work of breathing, speaking in full sentences without accessory muscle use. Cardiac: Regular rate and rhythm.  Neuro: CN II-XII grossly intact, gait normal. Extremities: No peripheral edema.  Strong peripheral pulses.  Mental Status: No depression, anxiety, nor agitation. Logical though process. Skin: Warm and dry.  Assessment & Plan: Cameron Murphy was  seen today for discuss testosterone replacement therapy.  Diagnoses and associated orders for this visit:  Hypogonadism in male    I discussed risk and benefits with the patient today of testosterone supplementation therapy and my approach of minimizing these risks by checking a PSA and hemoglobin along with testosterone level approximately every 3 months until testosterone level has plateaued. After which we checked PSA and hemoglobin every 6-12 months. Time was taken to answer all of his questions, he will begin receiving 300 mg IM every 3 weeks and we will titrate as needed to achieve a physiologic testosterone level that improves his symptoms.  15 minutes spent face-to-face during visit today of which at least 50% was counseling or coordinating care regarding: 1. Hypogonadism in male       Return in about 3 weeks (around 12/23/2014) for Next Testosterone Shot - Nurse Visit.

## 2014-12-02 NOTE — Addendum Note (Signed)
Addended by: Avon GullyMCCRIMMON, Aristides Luckey C on: 12/02/2014 10:09 AM   Modules accepted: Orders

## 2014-12-23 ENCOUNTER — Ambulatory Visit (INDEPENDENT_AMBULATORY_CARE_PROVIDER_SITE_OTHER): Payer: PRIVATE HEALTH INSURANCE | Admitting: Sports Medicine

## 2014-12-23 VITALS — BP 186/90 | HR 94 | Ht 73.0 in | Wt 277.0 lb

## 2014-12-23 DIAGNOSIS — E291 Testicular hypofunction: Secondary | ICD-10-CM

## 2014-12-23 MED ORDER — TADALAFIL 20 MG PO TABS: ORAL_TABLET | ORAL | Status: DC

## 2014-12-23 MED ORDER — TESTOSTERONE CYPIONATE 200 MG/ML IM SOLN
300.0000 mg | Freq: Once | INTRAMUSCULAR | Status: AC
  Administered 2014-12-23: 300 mg via INTRAMUSCULAR

## 2014-12-23 NOTE — Assessment & Plan Note (Signed)
Testosterone injection as above. 

## 2014-12-23 NOTE — Progress Notes (Signed)
   Subjective:    Patient ID: Cameron Murphy, male    DOB: Aug 31, 1971, 44 y.o.   MRN: [id number]  HPI  Cameron Murphy comes to office today for testerone injection which he received without complication. He denies CP, SOB or headache. No other GU concerns at this time.    Review of Systems     Objective:   Physical Exam        Assessment & Plan:  Follow up injection 3 weeks.

## 2015-01-11 ENCOUNTER — Telehealth: Payer: Self-pay

## 2015-01-11 NOTE — Telephone Encounter (Signed)
PA received for Cialis 20mg  through [facility]- PA# [phone] JL, good through 01/08/15-01/08/18

## 2015-01-13 ENCOUNTER — Encounter: Payer: Self-pay | Admitting: Family Medicine

## 2015-01-13 ENCOUNTER — Other Ambulatory Visit: Payer: Self-pay | Admitting: *Deleted

## 2015-01-13 ENCOUNTER — Ambulatory Visit: Payer: PRIVATE HEALTH INSURANCE

## 2015-01-13 ENCOUNTER — Ambulatory Visit (INDEPENDENT_AMBULATORY_CARE_PROVIDER_SITE_OTHER): Payer: PRIVATE HEALTH INSURANCE | Admitting: Family Medicine

## 2015-01-13 ENCOUNTER — Telehealth: Payer: Self-pay

## 2015-01-13 VITALS — BP 153/101 | HR 91 | Wt 287.0 lb

## 2015-01-13 DIAGNOSIS — N529 Male erectile dysfunction, unspecified: Secondary | ICD-10-CM

## 2015-01-13 DIAGNOSIS — E291 Testicular hypofunction: Secondary | ICD-10-CM

## 2015-01-13 MED ORDER — TESTOSTERONE CYPIONATE 200 MG/ML IM SOLN
300.0000 mg | Freq: Once | INTRAMUSCULAR | Status: AC
  Administered 2015-01-13: 300 mg via INTRAMUSCULAR

## 2015-01-13 MED ORDER — SILDENAFIL CITRATE 20 MG PO TABS: ORAL_TABLET | ORAL | Status: DC

## 2015-01-13 NOTE — Addendum Note (Signed)
Addended by: Wyline BeadyMCCRIMMON, Alicia Seib C on: 01/13/2015 02:42 PM   Modules accepted: Orders

## 2015-01-13 NOTE — Progress Notes (Signed)
CC: Cameron Murphy is a 44 y.o. male is here for discuss testosterone   Subjective: HPI:  Follow-up hypogonadism: He has had 2 shots of 300 mg of testosterone since I saw him last. He notes that he has more energy in the gym and has gained 5 pounds unintentionally which she is worried could be fat. There's been no change in his diet. He denies any known side effects from this medication other than that described above. No psychological changes.   He is concerned that he's having difficulty maintaining an erection and he is unable to afford Cialis. He was expecting that testosterone would've made a big difference in this by now. Symptoms are moderate in severity and present during any sexual encounter with his wife. He is monogamous. He denies any other genitourinary complaints.   Review Of Systems Outlined In HPI  Past Medical History  Diagnosis Date  . Hypertension     No past surgical history on file. Family History  Problem Relation Age of Onset  . Hypertension Mother     History   Social History  . Marital Status: Married    Spouse Name: N/A  . Number of Children: N/A  . Years of Education: N/A   Occupational History  . Not on file.   Social History Main Topics  . Smoking status: Never Smoker   . Smokeless tobacco: Never Used  . Alcohol Use: No  . Drug Use: No  . Sexual Activity: Yes   Other Topics Concern  . Not on file   Social History Narrative     Objective: BP 153/101 mmHg  Pulse 91  Wt 287 lb (130.182 kg)  Vital signs reviewed. General: Alert and Oriented, No Acute Distress HEENT: Pupils equal, round, reactive to light. Conjunctivae clear.  External ears unremarkable.  Moist mucous membranes. Lungs: Clear and comfortable work of breathing, speaking in full sentences without accessory muscle use. Cardiac: Regular rate and rhythm.  Neuro: CN II-XII grossly intact, gait normal. Extremities: No peripheral edema.  Strong peripheral pulses.  Mental Status:  No depression, anxiety, nor agitation. Logical though process. Skin: Warm and dry.  Assessment & Plan: Cameron Murphy was seen today for discuss testosterone.  Diagnoses and all orders for this visit:  Erectile dysfunction, unspecified erectile dysfunction type Orders: -     sildenafil (REVATIO) 20 MG tablet; 2-4 Tabs by mouth 45 minutes before sex, daily only as needed.  Hypogonadism in male   Hypogonadism: Clinically improving, discussed that his weight gain is almost definitely due to muscle, discussed physiologic effects of testosterone on muscle mass. He wants to know if he can have a larger dose of testosterone to help with resolving his erectile dysfunction, I discussed that my standard approach to therapy is to begin at either 200 or 300 mg every 3 weeks and that we only just above this based on testosterone level after 3 months of initiating therapy. We'll readdress this in late March or early April. Erectile dysfunction: Starting to generic Viagra which should be financially reasonable until testosterone hopefully gets in the normal range and helps with this condition.   No Follow-up on file.

## 2015-01-13 NOTE — Telephone Encounter (Signed)
Sent PA through cover my meds waiting on auth. - CF 

## 2015-01-13 NOTE — Progress Notes (Signed)
[facility]

## 2015-01-14 ENCOUNTER — Telehealth: Payer: Self-pay

## 2015-01-14 NOTE — Telephone Encounter (Signed)
PA denied for Sildenafil. [facility]. Patient has picked up Rx.

## 2015-01-26 ENCOUNTER — Ambulatory Visit (INDEPENDENT_AMBULATORY_CARE_PROVIDER_SITE_OTHER): Payer: PRIVATE HEALTH INSURANCE | Admitting: Physician Assistant

## 2015-01-26 ENCOUNTER — Encounter: Payer: Self-pay | Admitting: Physician Assistant

## 2015-01-26 VITALS — BP 149/90 | HR 100 | Ht 73.0 in | Wt 290.0 lb

## 2015-01-26 DIAGNOSIS — J209 Acute bronchitis, unspecified: Secondary | ICD-10-CM | POA: Diagnosis not present

## 2015-01-26 MED ORDER — AZITHROMYCIN 250 MG PO TABS: ORAL_TABLET | ORAL | Status: DC

## 2015-01-26 MED ORDER — ALBUTEROL SULFATE 108 (90 BASE) MCG/ACT IN AEPB: 2.0000 | INHALATION_SPRAY | RESPIRATORY_TRACT | Status: DC | PRN

## 2015-01-26 MED ORDER — HYDROCODONE-HOMATROPINE 5-1.5 MG/5ML PO SYRP: 5.0000 mL | ORAL_SOLUTION | Freq: Every evening | ORAL | Status: DC | PRN

## 2015-01-26 MED ORDER — ALBUTEROL SULFATE 108 (90 BASE) MCG/ACT IN AEPB: 2.0000 | INHALATION_SPRAY | RESPIRATORY_TRACT | Status: AC | PRN

## 2015-01-26 NOTE — Patient Instructions (Signed)

## 2015-01-26 NOTE — Progress Notes (Signed)
   Subjective:    Patient ID: Cameron Murphy, male    DOB: 1971/09/04, 44 y.o.   MRN: [id number]  HPI  Patient is a 44 year old male who presents to the clinic with 5 days of productive cough, body aches, chest tightness and wheezing. He did have a fever around 109 for a couple days. He denies any sinus pressure, nasal congestion or ear pain. He does feel like he is wheezing mostly at night. He does not have any history of lung disease and does not smoke. All of his children are sick in using breathing treatments. He did try their breathing treatments twice and did not seem to help significantly. He is using Mucinex over-the-counter with little relief.    Review of Systems  All other systems reviewed and are negative.      Objective:   Physical Exam  Constitutional: He is oriented to person, place, and time. He appears well-developed and well-nourished.  HENT:  Head: Normocephalic and atraumatic.  Right Ear: External ear normal.  Left Ear: External ear normal.  Nose: Nose normal.  Mouth/Throat: Oropharynx is clear and moist. No oropharyngeal exudate.  Eyes: Conjunctivae are normal. Right eye exhibits no discharge. Left eye exhibits no discharge.  Neck: Normal range of motion. Neck supple.  Cardiovascular: Normal rate, regular rhythm and normal heart sounds.   Pulmonary/Chest: Effort normal.  Did hear some mild expiratory wheezing of left base of lung.  Lymphadenopathy:    He has no cervical adenopathy.  Neurological: He is alert and oriented to person, place, and time.  Skin: Skin is dry.  Psychiatric: He has a normal mood and affect. His behavior is normal.          Assessment & Plan:  Acute bronchitis-patient was given Z-Pak today as well as Hycodan for cough. Since I did hear some wheezing at the base of left lung did give albuterol inhaler to use 2 puffs as needed every 4-6 hours for shortness of breath or wheezing. Not improving and certainly let us know. Handout was given.  Patient requests to be written on work for the rest of the week to rest. I did write him out.

## 2015-02-03 ENCOUNTER — Ambulatory Visit: Payer: PRIVATE HEALTH INSURANCE

## 2015-02-07 ENCOUNTER — Ambulatory Visit: Payer: PRIVATE HEALTH INSURANCE

## 2015-02-11 ENCOUNTER — Ambulatory Visit (INDEPENDENT_AMBULATORY_CARE_PROVIDER_SITE_OTHER): Payer: PRIVATE HEALTH INSURANCE | Admitting: Family Medicine

## 2015-02-11 ENCOUNTER — Other Ambulatory Visit: Payer: Self-pay | Admitting: Family Medicine

## 2015-02-11 ENCOUNTER — Other Ambulatory Visit: Payer: Self-pay | Admitting: *Deleted

## 2015-02-11 VITALS — BP 143/83 | HR 110 | Resp 16 | Wt 288.0 lb

## 2015-02-11 DIAGNOSIS — E291 Testicular hypofunction: Secondary | ICD-10-CM

## 2015-02-11 DIAGNOSIS — K219 Gastro-esophageal reflux disease without esophagitis: Secondary | ICD-10-CM

## 2015-02-11 MED ORDER — TESTOSTERONE CYPIONATE 200 MG/ML IM SOLN
300.0000 mg | INTRAMUSCULAR | Status: DC
  Administered 2015-02-11: 300 mg via INTRAMUSCULAR

## 2015-02-11 MED ORDER — RANITIDINE HCL 150 MG PO TABS
ORAL_TABLET | ORAL | Status: DC
Start: 2015-02-11 — End: 2015-02-28

## 2015-02-11 NOTE — Progress Notes (Signed)
   Subjective:    Patient ID: Cameron Murphy, male    DOB: Apr 03, 1971, 44 y.o.   MRN: [id number]  HPIPatient here for testosterone injection. Denies chest pain, headache, shortness of breath and mood changes. States has not taken Phentermine in past 30 days; has changed dietary habits and works out in gym; unhappy about today's weight gain and would like to re-start.    Review of Systems     Objective:   Physical Exam        Assessment & Plan:  Patient tolerated injection well and without complications. Will make appointment for next injection in 3 weeks and will discuss re-starting Phentermine with Dr.Hommel.

## 2015-02-18 ENCOUNTER — Other Ambulatory Visit: Payer: Self-pay | Admitting: Family Medicine

## 2015-02-28 ENCOUNTER — Other Ambulatory Visit: Payer: Self-pay | Admitting: Family Medicine

## 2015-02-28 DIAGNOSIS — K219 Gastro-esophageal reflux disease without esophagitis: Secondary | ICD-10-CM

## 2015-02-28 MED ORDER — RANITIDINE HCL 150 MG PO TABS
ORAL_TABLET | ORAL | Status: DC
Start: 2015-02-28 — End: 2015-08-30

## 2015-03-02 ENCOUNTER — Encounter: Payer: Self-pay | Admitting: Family Medicine

## 2015-03-02 ENCOUNTER — Ambulatory Visit (INDEPENDENT_AMBULATORY_CARE_PROVIDER_SITE_OTHER): Payer: PRIVATE HEALTH INSURANCE | Admitting: Family Medicine

## 2015-03-02 VITALS — BP 144/92 | HR 94 | Wt 287.0 lb

## 2015-03-02 DIAGNOSIS — E291 Testicular hypofunction: Secondary | ICD-10-CM | POA: Diagnosis not present

## 2015-03-02 DIAGNOSIS — K219 Gastro-esophageal reflux disease without esophagitis: Secondary | ICD-10-CM | POA: Diagnosis not present

## 2015-03-02 DIAGNOSIS — E669 Obesity, unspecified: Secondary | ICD-10-CM

## 2015-03-02 DIAGNOSIS — N529 Male erectile dysfunction, unspecified: Secondary | ICD-10-CM | POA: Diagnosis not present

## 2015-03-02 MED ORDER — TADALAFIL 20 MG PO TABS: 10.0000 mg | ORAL_TABLET | ORAL | Status: DC | PRN

## 2015-03-02 MED ORDER — PHENTERMINE HCL 37.5 MG PO TABS: 37.5000 mg | ORAL_TABLET | Freq: Every day | ORAL | Status: DC

## 2015-03-02 MED ORDER — TESTOSTERONE CYPIONATE 200 MG/ML IM SOLN: 200.0000 mg | Freq: Once | INTRAMUSCULAR | Status: DC

## 2015-03-02 MED ORDER — PANTOPRAZOLE SODIUM 40 MG PO TBEC: 40.0000 mg | DELAYED_RELEASE_TABLET | Freq: Every day | ORAL | Status: AC

## 2015-03-02 MED ORDER — TESTOSTERONE CYPIONATE 200 MG/ML IM SOLN
300.0000 mg | Freq: Once | INTRAMUSCULAR | Status: AC
  Administered 2015-03-02: 300 mg via INTRAMUSCULAR

## 2015-03-02 NOTE — Progress Notes (Signed)
CC: Cameron Murphy is a 44 y.o. male is here for f/u phentermine and testosterone injection   Subjective: HPI:  Follow-up obesity: He's been able to lose 2 pounds every month for the past 2 months with working out doing cardio and cutting back on soda. He wants know if he can restart phentermine.  Follow-up erectile dysfunction: He doesn't think he's had any benefit with difficulty initiating and maintaining erection since starting testosterone. He finds that generic Viagra is helpful however only if he takes more than 2 tablets. He also thinks that Cialis is more potent. Denies any other genitourinary complaints  Complains of waking up with a sense of regurgitating acid 2 weeks ago. This happens intermittently and is bothering him to a moderate degree. Never happens when he is up and awake. He will look him up in the middle night. He denies vomiting abdominal pain unintentional weight loss or chest pain.  Since starting testosterone he believes that he is having big improvement with his fatigue. Longer feels like he could take a nap anytime during the day. Denies any known side effects from the medication   Review Of Systems Outlined In HPI  Past Medical History  Diagnosis Date  . Hypertension     No past surgical history on file. Family History  Problem Relation Age of Onset  . Hypertension Mother     History   Social History  . Marital Status: Married    Spouse Name: N/A  . Number of Children: N/A  . Years of Education: N/A   Occupational History  . Not on file.   Social History Main Topics  . Smoking status: Never Smoker   . Smokeless tobacco: Never Used  . Alcohol Use: No  . Drug Use: No  . Sexual Activity: Yes   Other Topics Concern  . Not on file   Social History Narrative     Objective: BP 144/92 mmHg  Pulse 94  Wt 287 lb (130.182 kg)  Vital signs reviewed. General: Alert and Oriented, No Acute Distress HEENT: Pupils equal, round, reactive to light.  Conjunctivae clear.  External ears unremarkable.  Moist mucous membranes. Lungs: Clear and comfortable work of breathing, speaking in full sentences without accessory muscle use. Cardiac: Regular rate and rhythm.  Neuro: CN II-XII grossly intact, gait normal. Extremities: No peripheral edema.  Strong peripheral pulses.  Mental Status: No depression, anxiety, nor agitation. Logical though process. Skin: Warm and dry. Assessment & Plan: Cameron Murphy was seen today for f/u phentermine and testosterone injection.  Diagnoses and all orders for this visit:  Obesity (BMI 30-39.9)  Erectile dysfunction, unspecified erectile dysfunction type Orders: -     tadalafil (CIALIS) 20 MG tablet; Take 0.5-1 tablets (10-20 mg total) by mouth every other day as needed for erectile dysfunction.  Gastroesophageal reflux disease, esophagitis presence not specified Orders: -     pantoprazole (PROTONIX) 40 MG tablet; Take 1 tablet (40 mg total) by mouth daily. To prevent reflux.  Hypogonadism in male  Other orders -     phentermine (ADIPEX-P) 37.5 MG tablet; Take 1 tablet (37.5 mg total) by mouth daily before breakfast.   obesity: Improving, restart phentermine and continue exercising most days of the week with calorie restriction Erectile dysfunction: Improved with as needed generic Viagra versus Cialis. I provided him with refills on Cialis should he prefer to stick with this now that his insurance covers it up a little better. GERD: Uncontrolled start Protonix Hypogonadism: Improving continue testosterone every 3 weeks with  rechecking labs in April.    Return in about 4 weeks (around 03/30/2015) for Nurse Visit BP/Weight.

## 2015-03-04 ENCOUNTER — Ambulatory Visit: Payer: PRIVATE HEALTH INSURANCE | Admitting: Family Medicine

## 2015-03-23 ENCOUNTER — Ambulatory Visit (INDEPENDENT_AMBULATORY_CARE_PROVIDER_SITE_OTHER): Payer: PRIVATE HEALTH INSURANCE | Admitting: Family Medicine

## 2015-03-23 ENCOUNTER — Telehealth: Payer: Self-pay | Admitting: *Deleted

## 2015-03-23 VITALS — BP 140/90 | HR 90 | Wt 291.0 lb

## 2015-03-23 DIAGNOSIS — R635 Abnormal weight gain: Secondary | ICD-10-CM

## 2015-03-23 DIAGNOSIS — E291 Testicular hypofunction: Secondary | ICD-10-CM

## 2015-03-23 MED ORDER — TESTOSTERONE CYPIONATE 200 MG/ML IM SOLN: 300.0000 mg | Freq: Once | INTRAMUSCULAR | Status: DC

## 2015-03-23 NOTE — Telephone Encounter (Signed)
Left message on vm with reccomendations

## 2015-03-23 NOTE — Progress Notes (Signed)
   Subjective:    Patient ID: Cameron DibbleMarlon Murphy, male    DOB: 1971-02-25, 44 y.o.   MRN: [id number]  HPI Patient came into office today for testosterone injection. Denies chest pain, shortness of breath, headaches and problems associated with taking this medication. Patient states he has had no abnornal mood swings. Patient is also here for blood pressure and weight check. Denies any trouble sleeping, palpitations, or any other medication problems.    Review of Systems     Objective:   Physical Exam        Assessment & Plan:  Patient tolerated injection in ROUQ well without complications. Patient advised to schedule his next injection for 3 weeks from today. Patient has not lost weight. A refill for Phentermine will be reviewed by provider prior to being sent to patient preferred pharmacy. Patient advised, Verbalized understanding, no further questions.

## 2015-03-23 NOTE — Progress Notes (Signed)
Weight gain therefore no refill available based on FDA requirements for prescribing this medication.

## 2015-03-23 NOTE — Telephone Encounter (Signed)
-----   Message from Laren BoomSean Hommel, DO sent at 03/23/2015  3:27 PM EDT ----- Sue LushAndrea, Will you please let patient know: Weight gain therefore no refill available based on FDA requirements for prescribing this medication.

## 2015-04-14 ENCOUNTER — Ambulatory Visit (INDEPENDENT_AMBULATORY_CARE_PROVIDER_SITE_OTHER): Payer: PRIVATE HEALTH INSURANCE | Admitting: Sports Medicine

## 2015-04-14 VITALS — BP 136/89 | HR 98 | Wt 289.0 lb

## 2015-04-14 DIAGNOSIS — E291 Testicular hypofunction: Secondary | ICD-10-CM | POA: Diagnosis not present

## 2015-04-14 DIAGNOSIS — E039 Hypothyroidism, unspecified: Secondary | ICD-10-CM

## 2015-04-14 MED ORDER — LEVOTHYROXINE SODIUM 75 MCG PO TABS: 75.0000 ug | ORAL_TABLET | Freq: Every day | ORAL | Status: DC

## 2015-04-14 MED ORDER — TESTOSTERONE CYPIONATE 200 MG/ML IM SOLN
300.0000 mg | Freq: Once | INTRAMUSCULAR | Status: AC
  Administered 2015-04-14: 300 mg via INTRAMUSCULAR

## 2015-04-14 NOTE — Assessment & Plan Note (Signed)
Testosterone injection as above. 

## 2015-04-14 NOTE — Progress Notes (Signed)
   Subjective:    Patient ID: Cameron DibbleMarlon Murphy, male    DOB: 31-Dec-1970, 44 y.o.   MRN: [id number]  HPI Patient came into office today for testosterone injection. Denies chest pain, shortness of breath, headaches and problems associated with taking this medication. Patient states he has had no abnornal mood swings. Patient does report he needs a refill on his levothyroxine, he is completely out. Advised Pt to keep his upcoming PCP appt and I would send over a refill. Patient does voice some concern that this Rx is causing him to gain weight.   Review of Systems     Objective:   Physical Exam        Assessment & Plan:  Patient tolerated injection well in LOUQ without complications. Patient advised to schedule his next injection for 3 weeks from today.

## 2015-04-22 ENCOUNTER — Ambulatory Visit: Payer: PRIVATE HEALTH INSURANCE | Admitting: Family Medicine

## 2015-05-03 ENCOUNTER — Encounter: Payer: Self-pay | Admitting: Family Medicine

## 2015-05-03 ENCOUNTER — Ambulatory Visit (INDEPENDENT_AMBULATORY_CARE_PROVIDER_SITE_OTHER): Payer: PRIVATE HEALTH INSURANCE | Admitting: Family Medicine

## 2015-05-03 VITALS — BP 145/87 | HR 89 | Wt 294.0 lb

## 2015-05-03 DIAGNOSIS — I10 Essential (primary) hypertension: Secondary | ICD-10-CM | POA: Diagnosis not present

## 2015-05-03 DIAGNOSIS — E291 Testicular hypofunction: Secondary | ICD-10-CM | POA: Diagnosis not present

## 2015-05-03 DIAGNOSIS — E669 Obesity, unspecified: Secondary | ICD-10-CM

## 2015-05-03 MED ORDER — AMLODIPINE BESY-BENAZEPRIL HCL 10-20 MG PO CAPS: 1.0000 | ORAL_CAPSULE | Freq: Every day | ORAL | Status: DC

## 2015-05-03 MED ORDER — CHLORTHALIDONE 25 MG PO TABS: ORAL_TABLET | ORAL | Status: DC

## 2015-05-03 MED ORDER — LIRAGLUTIDE -WEIGHT MANAGEMENT 18 MG/3ML ~~LOC~~ SOPN: 0.6000 mg | PEN_INJECTOR | Freq: Every day | SUBCUTANEOUS | Status: DC

## 2015-05-03 NOTE — Progress Notes (Signed)
CC: Cameron DibbleMarlon Murphy is a 44 y.o. male is here for f/u wt   Subjective: HPI:  Follow-up essential hypertension: Continues on Lotrel and chlorthalidone with no outside blood pressures reported. No chest pain shortness of breath orthopnea nor peripheral edema. Working out most days of the week. Unintentional weight loss over the past 2 or 3 months.  Follow hypogonadism: He thinks that testosterone has helped improve libido and resolved erectile dysfunction. He's also had some energy improvements in the gym but denies any other gains from testosterone. He believes that this because she ran to his unintentional weight gain. Denies any other known side effects.  Expresses interest in taking medication to help with weight loss. He's had success with phentermine but it lost its effectiveness pretty Quickly. Tries to watch what he eats he's cut out soda and many carbohydrate in his diet.   Review Of Systems Outlined In HPI  Past Medical History  Diagnosis Date  . Hypertension     No past surgical history on file. Family History  Problem Relation Age of Onset  . Hypertension Mother     History   Social History  . Marital Status: Married    Spouse Name: N/A  . Number of Children: N/A  . Years of Education: N/A   Occupational History  . Not on file.   Social History Main Topics  . Smoking status: Never Smoker   . Smokeless tobacco: Never Used  . Alcohol Use: No  . Drug Use: No  . Sexual Activity: Yes   Other Topics Concern  . Not on file   Social History Narrative     Objective: BP 145/87 mmHg  Pulse 89  Wt 294 lb (133.358 kg)  Vital signs reviewed. General: Alert and Oriented, No Acute Distress HEENT: Pupils equal, round, reactive to light. Conjunctivae clear.  External ears unremarkable.  Moist mucous membranes. Lungs: Clear and comfortable work of breathing, speaking in full sentences without accessory muscle use. Cardiac: Regular rate and rhythm.  Neuro: CN II-XII  grossly intact, gait normal. Extremities: No peripheral edema.  Strong peripheral pulses.  Mental Status: No depression, anxiety, nor agitation. Logical though process. Skin: Warm and dry.  Assessment & Plan: Cameron Murphy was seen today for f/u wt.  Diagnoses and all orders for this visit:  Essential hypertension Orders: -     amLODipine-benazepril (LOTREL) 10-20 MG per capsule; Take 1 capsule by mouth daily. -     chlorthalidone (HYGROTON) 25 MG tablet; TAKE TWO TABLETS BY MOUTH DAILY  Hypogonadism in male  Obesity Orders: -     Liraglutide -Weight Management (SAXENDA) 18 MG/3ML SOPN; Inject 0.6 mg into the skin daily. Increase by 0.6mg  every week until 3mg  daily.   Obesity: Discussed diet and exercise interventions, he's been unsuccessful with weight loss following these interventions. He's interested in [place] and I've asked him to go to the website to download the savings card before picking up his prescription.  Essential hypertension: Uncontrolled due to recent weight gain, no need to change medications as long as he is able to start Saxenda Hypogonadism: Improved continue testosterone  Return in about 3 months (around 08/03/2015).

## 2015-05-04 ENCOUNTER — Telehealth: Payer: Self-pay | Admitting: *Deleted

## 2015-05-04 NOTE — Telephone Encounter (Signed)
Pt states the saxsenda will need a PA. Can you please check on this?

## 2015-05-05 ENCOUNTER — Telehealth: Payer: Self-pay | Admitting: Family Medicine

## 2015-05-05 NOTE — Telephone Encounter (Signed)
I called the patient and I am going to start PA on this medication today. - CF

## 2015-05-05 NOTE — Telephone Encounter (Signed)
noted 

## 2015-05-05 NOTE — Telephone Encounter (Signed)
Received a call from patient stating the pharmacy told him that his Saxenda required a prior authorization. I sent through cover my meds and I am waiting on authorization. - CF

## 2015-05-06 ENCOUNTER — Telehealth: Payer: Self-pay | Admitting: Family Medicine

## 2015-05-06 DIAGNOSIS — E669 Obesity, unspecified: Secondary | ICD-10-CM

## 2015-05-06 NOTE — Telephone Encounter (Signed)
Left message on vm with advice 

## 2015-05-06 NOTE — Telephone Encounter (Signed)
Sue Lushndrea, Will you please let patient know that his insurance has denied coverage for Saxenda, I would recommend that he go to [email or web address] and print off a savings voucher.  If this doesn't provide a workaround for being able to pick up the Rx please let me know because I think this would represent that the pharmacy is not processing the savings voucher correctly.

## 2015-05-06 NOTE — Telephone Encounter (Signed)
If he wants to ask his pharmacist to compare prices on MilnorBelviq, Contrave, Qsymia these are the only other options I know of.

## 2015-05-06 NOTE — Telephone Encounter (Signed)
Pt wants to know if there are any other medications that are comparable. The medication is still too much with the savings voucher

## 2015-05-06 NOTE — Telephone Encounter (Signed)
Bernie CoveySaxenda was denied due to the medication is not covered by the patients insurance. - CF

## 2015-05-12 ENCOUNTER — Ambulatory Visit: Payer: PRIVATE HEALTH INSURANCE

## 2015-05-17 ENCOUNTER — Ambulatory Visit (INDEPENDENT_AMBULATORY_CARE_PROVIDER_SITE_OTHER): Payer: PRIVATE HEALTH INSURANCE | Admitting: Family Medicine

## 2015-05-17 ENCOUNTER — Ambulatory Visit: Payer: PRIVATE HEALTH INSURANCE

## 2015-05-17 ENCOUNTER — Encounter: Payer: Self-pay | Admitting: Family Medicine

## 2015-05-17 VITALS — BP 127/80 | HR 98 | Wt 290.0 lb

## 2015-05-17 DIAGNOSIS — E669 Obesity, unspecified: Secondary | ICD-10-CM | POA: Diagnosis not present

## 2015-05-17 DIAGNOSIS — E291 Testicular hypofunction: Secondary | ICD-10-CM | POA: Diagnosis not present

## 2015-05-17 DIAGNOSIS — L0390 Cellulitis, unspecified: Secondary | ICD-10-CM

## 2015-05-17 MED ORDER — LORCASERIN HCL 10 MG PO TABS: ORAL_TABLET | ORAL | Status: DC

## 2015-05-17 MED ORDER — SULFAMETHOXAZOLE-TRIMETHOPRIM 800-160 MG PO TABS: 1.0000 | ORAL_TABLET | Freq: Two times a day (BID) | ORAL | Status: DC

## 2015-05-17 MED ORDER — TESTOSTERONE CYPIONATE 200 MG/ML IM SOLN
200.0000 mg | Freq: Once | INTRAMUSCULAR | Status: AC
  Administered 2015-05-17: 200 mg via INTRAMUSCULAR

## 2015-05-17 MED ORDER — NALTREXONE-BUPROPION HCL ER 8-90 MG PO TB12: ORAL_TABLET | ORAL | Status: DC

## 2015-05-17 MED ORDER — PHENTERMINE-TOPIRAMATE ER 3.75-23 MG PO CP24: ORAL_CAPSULE | ORAL | Status: DC

## 2015-05-17 NOTE — Addendum Note (Signed)
Addended by: Wyline Beady on: 05/17/2015 04:32 PM   Modules accepted: Orders

## 2015-05-17 NOTE — Progress Notes (Signed)
CC: Cameron Murphy is a 44 y.o. male is here for discuss wt loss medications   Subjective: HPI:  Complains of small pimples that are growing on his left shoulder, right axillary region and right abdomen. He's been able to squeeze pus out of them over the past couple days. They seem to be enlarging. They're kind of painful. No other interventions as of yet. He denies fevers, chills, nausea or flushing.  Follow-up obesity: Insurance would not cover Saxenda, he was still going  to have to pay $600 per month for this. He would like to know if there are other options to help with weight loss.  He started tried his best that recently started cutting out carbohydrates in his diet.   Review Of Systems Outlined In HPI  Past Medical History  Diagnosis Date  . Hypertension     No past surgical history on file. Family History  Problem Relation Age of Onset  . Hypertension Mother     History   Social History  . Marital Status: Married    Spouse Name: N/A  . Number of Children: N/A  . Years of Education: N/A   Occupational History  . Not on file.   Social History Main Topics  . Smoking status: Never Smoker   . Smokeless tobacco: Never Used  . Alcohol Use: No  . Drug Use: No  . Sexual Activity: Yes   Other Topics Concern  . Not on file   Social History Narrative     Objective: BP 127/80 mmHg  Pulse 98  Wt 290 lb (131.543 kg)  Vital signs reviewed. General: Alert and Oriented, No Acute Distress HEENT: Pupils equal, round, reactive to light. Conjunctivae clear.  External ears unremarkable.  Moist mucous membranes. Lungs: Clear and comfortable work of breathing, speaking in full sentences without accessory muscle use. Cardiac: Regular rate and rhythm.  Neuro: CN II-XII grossly intact, gait normal. Extremities: No peripheral edema.  Strong peripheral pulses.  Mental Status: No depression, anxiety, nor agitation. Logical though process. Skin: Warm and dry. single small pustules  on the left shoulder, right axilla, right abdomen,for showing none of these appear to have any induration or fluctuance.    Assessment & Plan: Cameron Murphy was seen today for discuss wt loss medications.  Diagnoses and all orders for this visit:  Cellulitis, unspecified cellulitis site, unspecified extremity site, unspecified laterality Orders: -     sulfamethoxazole-trimethoprim (BACTRIM DS,SEPTRA DS) 800-160 MG per tablet; Take 1 tablet by mouth 2 (two) times daily.  Obesity (BMI 30-39.9)  Other orders -     Naltrexone-Bupropion HCl ER (CONTRAVE) 8-90 MG TB12; One by mouth daily for one week then one by mouth twice a day thereafter. -     Phentermine-Topiramate (QSYMIA) 3.75-23 MG CP24; One by mouth daily. -     Lorcaserin HCl (BELVIQ) 10 MG TABS; One by mouth twice a day.    cellulitis: Start Bactrim,Signs and symptoms requring emergent/urgent reevaluation were discussed with the patient. Obesity: Discussed options including the above medications. I prepared him though that if his insurance plan is set so that he has a relatively high deductible and he has not met this none of these medications will have good coverage. Regardless I provided him with savings cards for each of these individual medications. He understands that he should not take more than 1 of these medications and that the prescriptions were only to help with comparing prices. He should choose only 1 to start based on price.  25  minutes spent face-to-face during visit today of which at least 50% was counseling or coordinating care regarding: 1. Cellulitis, unspecified cellulitis site, unspecified extremity site, unspecified laterality   2. Obesity (BMI 30-39.9)       Return in about 4 weeks (around 06/14/2015) for Weight Discussion.Marland Kitchen

## 2015-06-02 ENCOUNTER — Ambulatory Visit (INDEPENDENT_AMBULATORY_CARE_PROVIDER_SITE_OTHER): Payer: PRIVATE HEALTH INSURANCE | Admitting: Sports Medicine

## 2015-06-02 VITALS — BP 130/84 | HR 79 | Wt 284.0 lb

## 2015-06-02 DIAGNOSIS — E291 Testicular hypofunction: Secondary | ICD-10-CM | POA: Diagnosis not present

## 2015-06-02 MED ORDER — TESTOSTERONE CYPIONATE 200 MG/ML IM SOLN
200.0000 mg | Freq: Once | INTRAMUSCULAR | Status: AC
  Administered 2015-06-02: 200 mg via INTRAMUSCULAR

## 2015-06-02 NOTE — Progress Notes (Signed)
   Subjective:    Patient ID: Cameron DibbleMarlon Murphy, male    DOB: Jan 31, 1971, 44 y.o.   MRN: [id number]  HPI  Patient was here for a testosterone injection. Denies chest pain, SOB, headaches and problems with medications or mood changes.   Review of Systems     Objective:   Physical Exam        Assessment & Plan:  Patient tolerated injection well in the LUOQ with no complications. Patient advised to schedule next injection in 14 days.

## 2015-06-02 NOTE — Assessment & Plan Note (Signed)
Testosterone injection as above Patient needs repeat testosterone and PSA as well as hemoglobin.

## 2015-06-03 NOTE — Progress Notes (Signed)
Left detailed message and requested a call back.

## 2015-06-07 NOTE — Progress Notes (Signed)
Called and left second detailed message and requested a call back.

## 2015-06-17 NOTE — Progress Notes (Signed)
made 3 attempts to contact pt on the above matter. Closing encounter.

## 2015-06-25 ENCOUNTER — Other Ambulatory Visit: Payer: Self-pay | Admitting: Family Medicine

## 2015-07-13 ENCOUNTER — Ambulatory Visit (INDEPENDENT_AMBULATORY_CARE_PROVIDER_SITE_OTHER): Payer: PRIVATE HEALTH INSURANCE | Admitting: Family Medicine

## 2015-07-13 ENCOUNTER — Encounter: Payer: Self-pay | Admitting: Family Medicine

## 2015-07-13 VITALS — BP 114/68 | HR 92 | Ht 73.0 in | Wt 287.0 lb

## 2015-07-13 DIAGNOSIS — B351 Tinea unguium: Secondary | ICD-10-CM

## 2015-07-13 DIAGNOSIS — E669 Obesity, unspecified: Secondary | ICD-10-CM

## 2015-07-13 DIAGNOSIS — E291 Testicular hypofunction: Secondary | ICD-10-CM

## 2015-07-13 MED ORDER — TERBINAFINE HCL 250 MG PO TABS: 250.0000 mg | ORAL_TABLET | Freq: Every day | ORAL | Status: DC

## 2015-07-13 MED ORDER — LORCASERIN HCL 10 MG PO TABS: ORAL_TABLET | ORAL | Status: DC

## 2015-07-13 MED ORDER — TESTOSTERONE CYPIONATE 200 MG/ML IM SOLN
200.0000 mg | Freq: Once | INTRAMUSCULAR | Status: AC
  Administered 2015-07-13: 200 mg via INTRAMUSCULAR

## 2015-07-13 NOTE — Progress Notes (Signed)
CC: Cameron Murphy is a 44 y.o. male is here for Hypogonadism   Subjective: HPI:  Follow-up hypogonadism: He was lost to follow-up last month for testosterone injections. He would like to continue injections but is overdue for testosterone, PSA and hemoglobin. Denies any exertional chest pain. He believes that the testosterone injections have helped with libido and energy.  Follow-up obesity: He eventually got Belviq for $70 a month. He's been taking this daily without any known side effects. 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 He would like to continue this medication if possible  Complaints of enlarging right great toenail that has been present for matter of years. He was once told it was a fungus. He's had no interventions as of yet. He would like to know what he can do to have it corrected and treated. He denies any skin or nail changes elsewhere. Denies any history of liver disease   Review Of Systems Outlined In HPI  Past Medical History  Diagnosis Date  . Hypertension     No past surgical history on file. Family History  Problem Relation Age of Onset  . Hypertension Mother     Social History   Social History  . Marital Status: Married    Spouse Name: N/A  . Number of Children: N/A  . Years of Education: N/A   Occupational History  . Not on file.   Social History Main Topics  . Smoking status: Never Smoker   . Smokeless tobacco: Never Used  . Alcohol Use: No  . Drug Use: No  . Sexual Activity: Yes   Other Topics Concern  . Not on file   Social History Narrative     Objective: BP 114/68 mmHg  Pulse 92  Ht  (1.854 m)  Wt 287 lb (130.182 kg)  BMI 37.87 kg/m2  Vital signs reviewed. General: Alert and Oriented, No Acute Distress HEENT: Pupils equal, round, reactive to light. Conjunctivae clear.  External ears unremarkable.  Moist mucous membranes. Lungs: Clear and  comfortable work of breathing, speaking in full sentences without accessory muscle use. Cardiac: Regular rate and rhythm.  Neuro: CN II-XII grossly intact, gait normal. Extremities: No peripheral edema.  Strong peripheral pulses. Right great toe is hyperkeratotic and enlarged. Discolored yellow and brown. Mental Status: No depression, anxiety, nor agitation. Logical though process. Skin: Warm and dry.  Assessment & Plan: Cameron Murphy was seen today for hypogonadism.  Diagnoses and all orders for this visit:  Hypogonadism in male -     testosterone cypionate (DEPOTESTOSTERONE CYPIONATE) injection 200 mg; Inject 1 mL (200 mg total) into the muscle once.  Obesity (BMI 30-39.9)  Toenail fungus -     terbinafine (LAMISIL) 250 MG tablet; Take 1 tablet (250 mg total) by mouth daily.  Other orders -     Lorcaserin HCl (BELVIQ) 10 MG TABS; One by mouth twice a day.   hypogonadism: Since been more than a month since his last injection I likely would have an injection first and then wait one half weeks before getting testosterone, PSA and hemoglobin level. He agrees to come back for lab visit at that time.   obesity: Overall improving but slowly, continue belviq Toenail fungus: Start terbinafine for the next 3 months.  25 minutes spent face-to-face during visit today of which at least 50% was counseling or coordinating care regarding: 1. Hypogonadism in male   2. Obesity (BMI  30-39.9)   3. Toenail fungus      No Follow-up on file.

## 2015-08-30 ENCOUNTER — Ambulatory Visit (INDEPENDENT_AMBULATORY_CARE_PROVIDER_SITE_OTHER): Payer: PRIVATE HEALTH INSURANCE | Admitting: Family Medicine

## 2015-08-30 ENCOUNTER — Encounter: Payer: Self-pay | Admitting: Family Medicine

## 2015-08-30 VITALS — BP 154/93 | HR 76 | Ht 73.0 in | Wt 303.0 lb

## 2015-08-30 DIAGNOSIS — Z23 Encounter for immunization: Secondary | ICD-10-CM | POA: Diagnosis not present

## 2015-08-30 DIAGNOSIS — E291 Testicular hypofunction: Secondary | ICD-10-CM | POA: Diagnosis not present

## 2015-08-30 DIAGNOSIS — E669 Obesity, unspecified: Secondary | ICD-10-CM | POA: Diagnosis not present

## 2015-08-30 DIAGNOSIS — E039 Hypothyroidism, unspecified: Secondary | ICD-10-CM

## 2015-08-30 DIAGNOSIS — Z0000 Encounter for general adult medical examination without abnormal findings: Secondary | ICD-10-CM

## 2015-08-30 DIAGNOSIS — I10 Essential (primary) hypertension: Secondary | ICD-10-CM

## 2015-08-30 MED ORDER — AMLODIPINE BESY-BENAZEPRIL HCL 10-20 MG PO CAPS: 1.0000 | ORAL_CAPSULE | Freq: Every day | ORAL | Status: DC

## 2015-08-30 MED ORDER — LEVOTHYROXINE SODIUM 75 MCG PO TABS: 75.0000 ug | ORAL_TABLET | Freq: Every day | ORAL | Status: DC

## 2015-08-30 MED ORDER — PHENTERMINE HCL 37.5 MG PO TABS: 37.5000 mg | ORAL_TABLET | Freq: Every day | ORAL | Status: DC

## 2015-08-30 MED ORDER — CHLORTHALIDONE 25 MG PO TABS: ORAL_TABLET | ORAL | Status: AC

## 2015-08-30 NOTE — Progress Notes (Signed)
CC: Cameron Murphy is a 44 y.o. male is here for Annual Exam   Subjective: HPI:  Colonoscopy: No current indication Prostate: Discussed screening risks/beneifts with patient during today's visit, checking PSA given testosterone use.  Influenza Vaccine: Will receive today Pneumovax: No current indication Td/Tdap: Will receive booster today Zoster: (Start 43 yo)  Requesting complete physical exam with his only concern being unintentional waking since I saw him last. His recognized that he ran out of levothyroxine sometime within the last month or 2. He has no problem taking his medication he just ran out of refills and forgot to request refills. Since Belviq does not seem to be helping with weight loss he wants to know if he can start on phentermine again.  Review of Systems - General ROS: negative for - chills, fever, night sweats, or weight loss Ophthalmic ROS: negative for - decreased vision Psychological ROS: negative for - anxiety or depression ENT ROS: negative for - hearing change, nasal congestion, tinnitus or allergies Hematological and Lymphatic ROS: negative for - bleeding problems, bruising or swollen lymph nodes Breast ROS: negative Respiratory ROS: no cough, shortness of breath, or wheezing Cardiovascular ROS: no chest pain or dyspnea on exertion Gastrointestinal ROS: no abdominal pain, change in bowel habits, or black or bloody stools Genito-Urinary ROS: negative for - genital discharge, genital ulcers, incontinence or abnormal bleeding from genitals Musculoskeletal ROS: negative for - joint pain or muscle pain Neurological ROS: negative for - headaches or memory loss Dermatological ROS: negative for lumps, mole changes, rash and skin lesion changes  Past Medical History  Diagnosis Date  . Hypertension     No past surgical history on file. Family History  Problem Relation Age of Onset  . Hypertension Mother     Social History   Social History  . Marital Status:  Married    Spouse Name: N/A  . Number of Children: N/A  . Years of Education: N/A   Occupational History  . Not on file.   Social History Main Topics  . Smoking status: Never Smoker   . Smokeless tobacco: Never Used  . Alcohol Use: No  . Drug Use: No  . Sexual Activity: Yes   Other Topics Concern  . Not on file   Social History Narrative     Objective: BP 154/93 mmHg  Pulse 76  Ht  (1.854 m)  Wt 303 lb (137.44 kg)  BMI 39.98 kg/m2  General: No Acute Distress HEENT: Atraumatic, normocephalic, conjunctivae normal without scleral icterus.  No nasal discharge, hearing grossly intact, TMs with good landmarks bilaterally with no middle ear abnormalities, posterior pharynx clear without oral lesions. Neck: Supple, trachea midline, no cervical nor supraclavicular adenopathy. Pulmonary: Clear to auscultation bilaterally without wheezing, rhonchi, nor rales. Cardiac: Regular rate and rhythm.  No murmurs, rubs, nor gallops. No peripheral edema.  2+ peripheral pulses bilaterally. Abdomen: Bowel sounds normal.  No masses.  Non-tender without rebound.  Negative Murphy's sign. MSK: Grossly intact, no signs of weakness.  Full strength throughout upper and lower extremities.  Full ROM in upper and lower extremities.  No midline spinal tenderness. Neuro: Gait unremarkable, CN II-XII grossly intact.  C5-C6 Reflex 2/4 Bilaterally, L4 Reflex 2/4 Bilaterally.  Cerebellar function intact. Skin: No rashes. Psych: Alert and oriented to person/place/time.  Thought process normal. No anxiety/depression.  Assessment & Plan: Cameron Murphy was seen today for annual exam.  Diagnoses and all orders for this visit:  Annual physical exam -     Lipid panel -  COMPLETE METABOLIC PANEL WITH GFR -     CBC -     PSA -     Testosterone -     TSH -     Tdap vaccine greater than or equal to 7yo IM  Hypothyroidism, unspecified hypothyroidism type -     TSH -     levothyroxine (SYNTHROID, LEVOTHROID) 75  MCG tablet; Take 1 tablet (75 mcg total) by mouth daily.  Hypogonadism in male -     PSA -     Testosterone  Obesity -     phentermine (ADIPEX-P) 37.5 MG tablet; Take 1 tablet (37.5 mg total) by mouth daily before breakfast.  Encounter for immunization  Essential hypertension -     amLODipine-benazepril (LOTREL) 10-20 MG capsule; Take 1 capsule by mouth daily. -     chlorthalidone (HYGROTON) 25 MG tablet; TAKE TWO TABLETS BY MOUTH DAILY  Other orders -     Flu Vaccine QUAD 36+ mos IM   Hypothyroidism: Uncontrolled restarting levothyroxine. Overdue for PSA and hemoglobin along with testosterone for testosterone use. Healthy lifestyle interventions including but not limited to regular exercise, a healthy low fat diet, moderation of salt intake, the dangers of tobacco/alcohol/recreational drug use, nutrition supplementation, and accident avoidance were discussed with the patient and a handout was provided for future reference.  Return in about 4 weeks (around 09/27/2015) for Blood Pressure and Weight.

## 2015-10-17 IMAGING — CR DG FOOT COMPLETE 3+V*L*
3 series · 3 of 3 positions shown · non-contrast
Comparison: None.

CLINICAL DATA: Left foot pain at base of first metatarsal and at
the anterior ankle.

EXAM:
LEFT FOOT - COMPLETE 3+ VIEW

[view not recorded (1 of 3)]
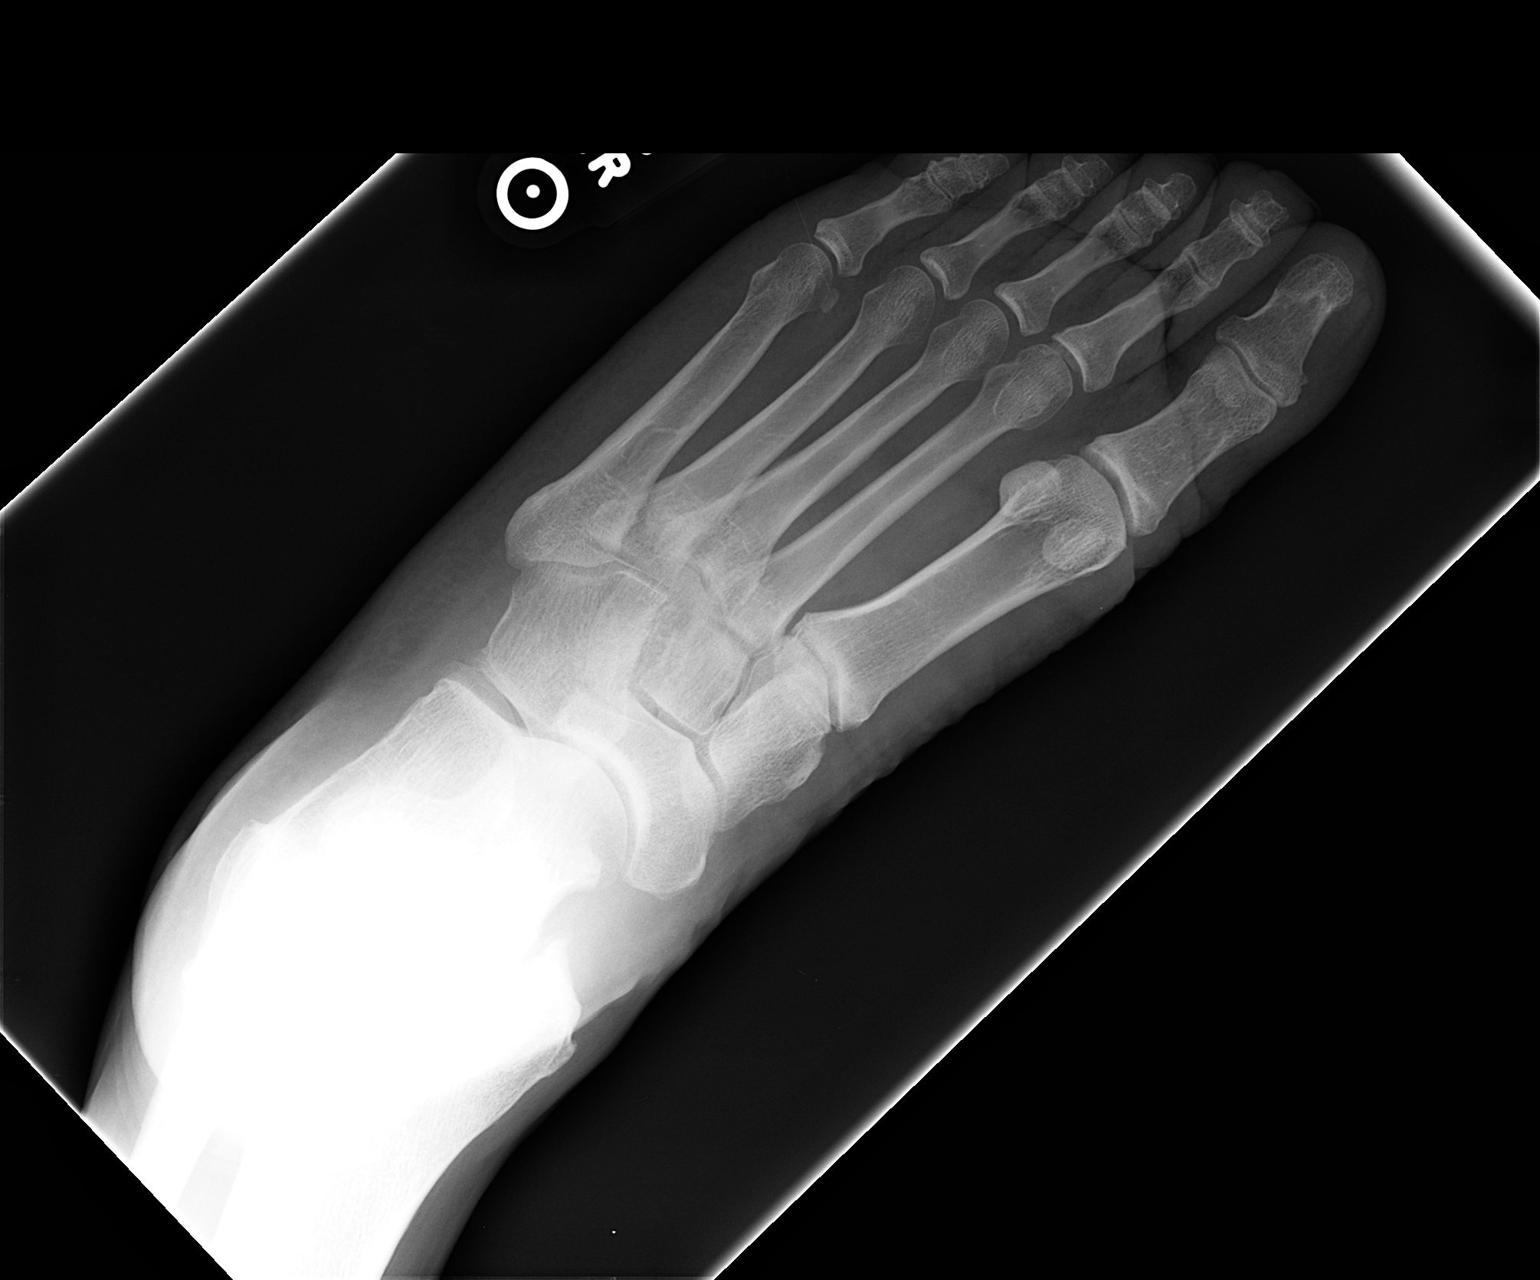

[view not recorded (2 of 3)]
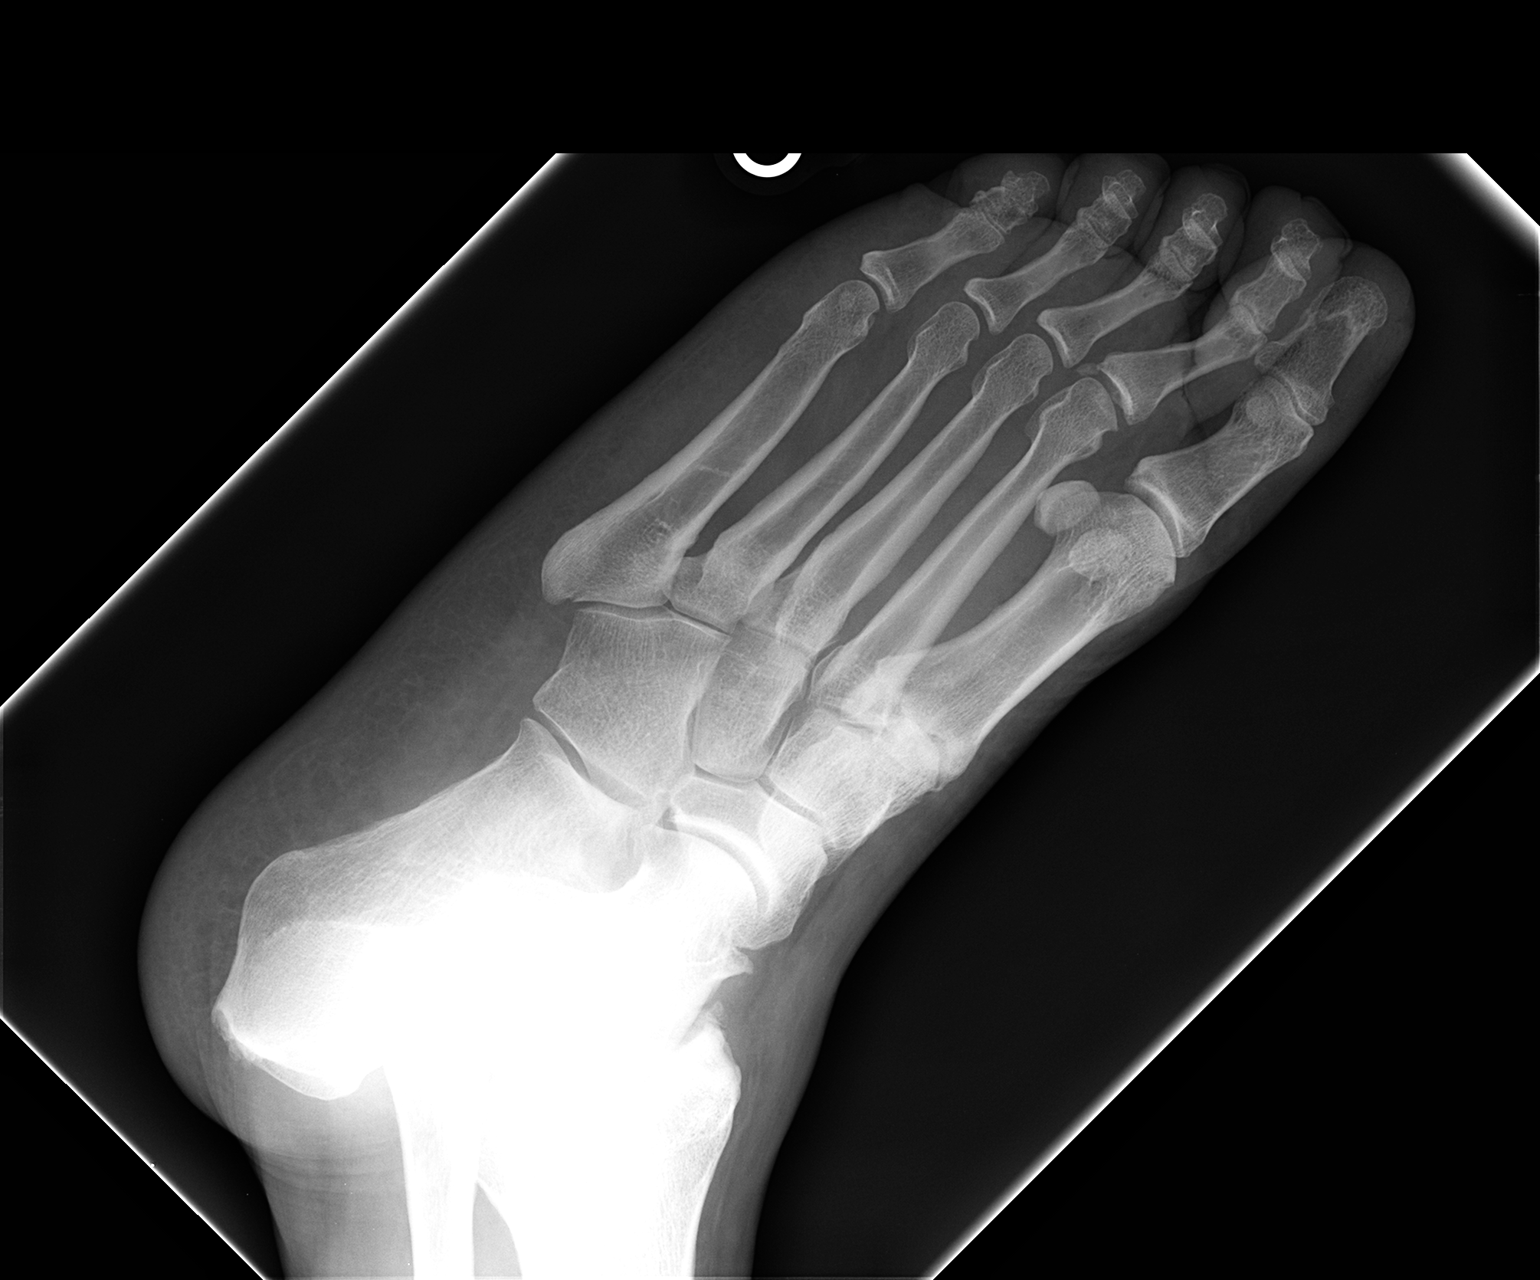

[view not recorded (3 of 3)]
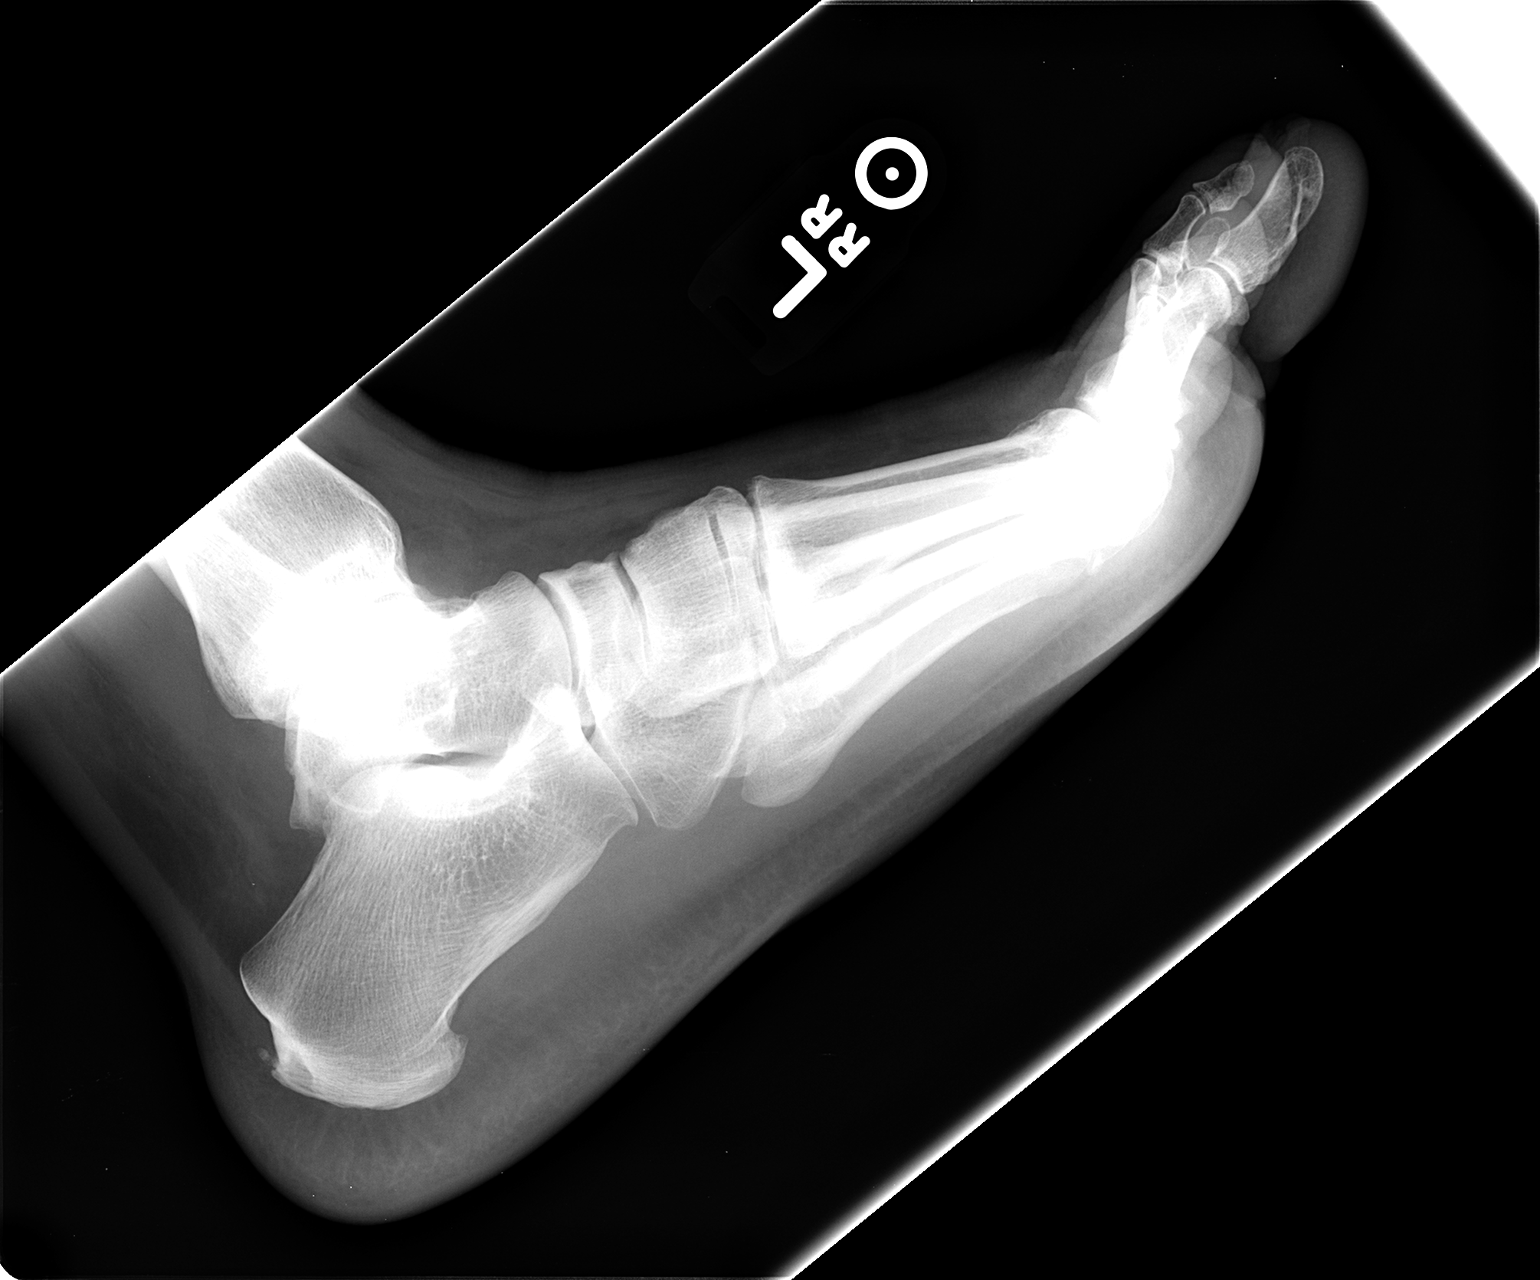

[3 of 3 positions shown; findings below may reference images not displayed]

FINDINGS: No acute bony abnormality. Specifically, no fracture, subluxation,
or dislocation. Soft tissues are intact. Joint spaces are
maintained. Normal bone mineralization.
IMPRESSION: No acute bony abnormality.

## 2015-11-30 ENCOUNTER — Emergency Department (INDEPENDENT_AMBULATORY_CARE_PROVIDER_SITE_OTHER)
Admission: EM | Admit: 2015-11-30 | Discharge: 2015-11-30 | Disposition: A | Discharge status: Home / Self Care | Payer: PRIVATE HEALTH INSURANCE | Source: Home / Self Care | Attending: Emergency Medicine | Admitting: Emergency Medicine

## 2015-11-30 ENCOUNTER — Encounter: Payer: Self-pay | Admitting: *Deleted

## 2015-11-30 DIAGNOSIS — R079 Chest pain, unspecified: Secondary | ICD-10-CM

## 2015-11-30 NOTE — ED Notes (Signed)
Pt vomited 2 days ago, followed by diarrhea ever since. Last night into today, c/o central abdominal pain and chest pain that is worse and goes into shoulder when lying down. SOB when lying down as well. patient has h/o hypertension, has not taken Bp meds today because of the diarrhea and abdominal pain. BP elevated @ 173/108, 161/111.

## 2015-11-30 NOTE — ED Provider Notes (Signed)
CSN: [id number]     Arrival date & time 11/30/15  1702 History   First MD Initiated Contact with Patient 11/30/15 1726     Chief Complaint  Patient presents with  . Diarrhea  . Abdominal Pain  . Chest Pain    HPI Pt vomited 2 days ago, followed by diarrhea, 3 loose, watery, nonbloody diarrhea stools daily ever since. Onset last night and continuing into today, c/o periumbilical and epigastric abdominal pain and a vague lower anterior chest discomfort that is worsening and can radiate into her right shoulder when lying down.  The chest and abdominal discomfort is 3 out of 10 intensity, can increase to 5 out of 10 intensity. He does not think the pain is worsened by exertion. Complains of shortness of breath when lying down as well.  Patient has h/o hypertension, has not taken BP meds today because of the diarrhea and abdominal pain.  BP  Elevated, when checked by nurse here @ 173/108, 161/111.  Currently denies nausea or vomiting today. No fever or chills. He denies numbness or tingling in extremities. He denies any head or neck or jaw pain.  He denies any history of heart problems. He drove himself [facility] to [facility] for evaluation and treatment at 5 PM today. Past Medical History  Diagnosis Date  . Hypertension    History reviewed. No pertinent past surgical history. Family History  Problem Relation Age of Onset  . Hypertension Mother    Social History  Substance Use Topics  . Smoking status: Never Smoker   . Smokeless tobacco: Never Used  . Alcohol Use: No    Review of Systems  All other systems reviewed and are negative.   Allergies  Review of patient's allergies indicates no known allergies.  Home Medications   Prior to Admission medications   Medication Sig Start Date End Date Taking? Authorizing Provider  amLODipine-benazepril (LOTREL) 10-20 MG capsule Take 1 capsule by mouth daily. 08/30/15  Yes Sean Hommel, DO  chlorthalidone (HYGROTON) 25 MG  tablet TAKE TWO TABLETS BY MOUTH DAILY 08/30/15  Yes Sean Hommel, DO  levothyroxine (SYNTHROID, LEVOTHROID) 75 MCG tablet Take 1 tablet (75 mcg total) by mouth daily. 08/30/15  Yes Sean Hommel, DO  Albuterol Sulfate (PROAIR RESPICLICK) 108 (90 BASE) MCG/ACT AEPB Inhale 2 puffs into the lungs every 4 (four) hours as needed. Patient not taking: Reported on 08/30/2015 01/26/15   Lonna CobbJade L Breeback, PA-C  pantoprazole (PROTONIX) 40 MG tablet Take 1 tablet (40 mg total) by mouth daily. To prevent reflux. Patient not taking: Reported on 08/30/2015 03/02/15   Laren BoomSean Hommel, DO  tadalafil (CIALIS) 20 MG tablet Take 0.5-1 tablets (10-20 mg total) by mouth every other day as needed for erectile dysfunction. Patient not taking: Reported on 08/30/2015 03/02/15   Laren BoomSean Hommel, DO  testosterone cypionate (DEPOTESTOSTERONE CYPIONATE) 200 MG/ML injection Inject 1 mL (200 mg total) into the muscle every 21 ( twenty-one) days. 05/17/15   Laren BoomSean Hommel, DO   Meds Ordered and Administered this Visit  Medications - No data to display  BP 173/108 mmHg  Pulse 99  Temp(Src) 98.5 F (36.9 C) (Oral)  Resp 16  Wt 295 lb (133.811 kg)  SpO2 95% No data found.   Physical Exam  Constitutional: He is oriented to person, place, and time. He appears well-developed and well-nourished. No distress.  He appears fatigued, but he is alert, cooperative, no cardiorespiratory distress.  HENT:  Head: Normocephalic and atraumatic.  Eyes: Conjunctivae and EOM are  normal. Pupils are equal, round, and reactive to light. No scleral icterus.  Neck: Normal range of motion. No JVD present. No tracheal deviation present.  Cardiovascular: Normal rate, regular rhythm, normal heart sounds and intact distal pulses.  Exam reveals no gallop and no friction rub.   No murmur heard. Pulmonary/Chest: Effort normal and breath sounds normal.  Abdominal: He exhibits no distension and no mass. There is tenderness (Mild epigastric). There is no rebound and no  guarding.  Musculoskeletal: Normal range of motion.  Neurological: He is alert and oriented to person, place, and time. No cranial nerve deficit.  Skin: Skin is warm. No rash noted.  Psychiatric: He has a normal mood and affect.  Nursing note and vitals reviewed.  BP 173/108  ED Course  ED EKG  Date/Time: 11/30/2015 5:26 PM Performed by: Georgina Pillion, DAVID Authorized by: Georgina Pillion, DAVID Previous ECG: no previous ECG available Rhythm: sinus rhythm Ectopy comments: No ectopy Rate: normal BPM: 94 Conduction: conduction normal ST Elevation: V2, V3 and V4 T flattening: V5 and V6 Other: no other findings Clinical impression: abnormal ECG Comments: EKG performed at 1725 and 1726. Consistent findings. 1 mm ST elevations V2 and V3 and V4. No prior EKGs to compare.    Labs Review Labs Reviewed - No data to display  Imaging Review No results found.   MDM   1. Chest pain, unspecified chest pain type    accelerated hypertension. Abnormal EKG, I cannot rule out STEMI. His GI symptoms might or might not be related to the above, but need to rule out acute cardiac event first. I explained the need for transport to hospital emergency room for further definitive evaluation and treatment. I explained the risks of not doing so, including the risk of death if he were to have any worsening heart event. I advised  911 for EMS to transport patient to emergency room, but patient refused. He signed a waiver indicating his refusal to allow EMS transport to emergency room. He is being driven in his car to [facility] emergency room. We gave him a copy of his EKGs.     Lajean Manes, MD 11/30/15 2035

## 2015-12-01 ENCOUNTER — Telehealth: Payer: Self-pay | Admitting: *Deleted

## 2016-02-14 ENCOUNTER — Ambulatory Visit: Payer: PRIVATE HEALTH INSURANCE | Admitting: Family Medicine

## 2016-02-17 ENCOUNTER — Ambulatory Visit (INDEPENDENT_AMBULATORY_CARE_PROVIDER_SITE_OTHER): Payer: PRIVATE HEALTH INSURANCE | Admitting: Family Medicine

## 2016-02-17 ENCOUNTER — Encounter: Payer: Self-pay | Admitting: Family Medicine

## 2016-02-17 VITALS — BP 140/93 | HR 78 | Temp 98.6°F | Wt 291.0 lb

## 2016-02-17 DIAGNOSIS — R05 Cough: Secondary | ICD-10-CM | POA: Diagnosis not present

## 2016-02-17 DIAGNOSIS — R059 Cough, unspecified: Secondary | ICD-10-CM

## 2016-02-17 DIAGNOSIS — E669 Obesity, unspecified: Secondary | ICD-10-CM

## 2016-02-17 MED ORDER — PREDNISONE 20 MG PO TABS: ORAL_TABLET | ORAL | Status: AC

## 2016-02-17 MED ORDER — PHENTERMINE HCL 37.5 MG PO TABS
37.5000 mg | ORAL_TABLET | Freq: Every day | ORAL | Status: DC
Start: 2016-02-17 — End: 2016-03-30

## 2016-02-17 NOTE — Progress Notes (Signed)
CC: Cameron DibbleMarlon Murphy is a 45 y.o. male is here for Cough   Subjective: HPI:  Cough present for the last three weeks present on a daily basis, moderate in severity, only temporarily better with cough drops. No help from albuterol.  Present all hours of the day and interfering with sleep. Ever since this started he is not experienced any shortness of breath, wheezing, sputum production, fevers, chills, sore throat, nasal congestion or sinus pressure. When he is coughing he gets dizzy and this has started to interfere with his driving.  He is requesting a refill phentermine spell a few months and has been off this medication and he tells me he is motivated to start losing weight again.     Review Of Systems Outlined In HPI  Past Medical History  Diagnosis Date  . Hypertension     No past surgical history on file. Family History  Problem Relation Age of Onset  . Hypertension Mother     Social History   Social History  . Marital Status: Married    Spouse Name: N/A  . Number of Children: N/A  . Years of Education: N/A   Occupational History  . Not on file.   Social History Main Topics  . Smoking status: Never Smoker   . Smokeless tobacco: Never Used  . Alcohol Use: No  . Drug Use: No  . Sexual Activity: Yes   Other Topics Concern  . Not on file   Social History Narrative     Objective: BP 140/93 mmHg  Pulse 78  Temp(Src) 98.6 F (37 C) (Oral)  Wt 291 lb (131.997 kg)  SpO2 93%  General: Alert and Oriented, No Acute Distress HEENT: Pupils equal, round, reactive to light. Conjunctivae clear.  External ears unremarkable, canals clear with intact TMs with appropriate landmarks.  Middle ear appears open without effusion. Pink inferior turbinates.  Moist mucous membranes, pharynx without inflammation nor lesions.  Neck supple without palpable lymphadenopathy nor abnormal masses. Lungs: Clear to auscultation bilaterally, no wheezing/ronchi/rales.  Comfortable work of  breathing. Good air movement. Cardiac: Regular rate and rhythm. Normal S1/S2.  No murmurs, rubs, nor gallops.   Extremities: No peripheral edema.  Strong peripheral pulses.  Mental Status: No depression, anxiety, nor agitation. Skin: Warm and dry.  Assessment & Plan: [place] Murphy was seen today for cough.  Diagnoses and all orders for this visit:  Cough -     predniSONE (DELTASONE) 20 MG tablet; Three tabs at once daily for five days.  Obesity  Other orders -     phentermine (ADIPEX-P) 37.5 MG tablet; Take 1 tablet (37.5 mg total) by mouth daily before breakfast.   Cough: No sign of bacterial infection therefore start prednisone and call if no better by Monday. Obesity: Restarting phentermine but per parent and that he may not have such a robust response as he did when he took for the first time last year.   No Follow-up on file.

## 2016-02-27 ENCOUNTER — Other Ambulatory Visit: Payer: Self-pay | Admitting: Family Medicine

## 2016-03-30 ENCOUNTER — Ambulatory Visit (INDEPENDENT_AMBULATORY_CARE_PROVIDER_SITE_OTHER): Payer: PRIVATE HEALTH INSURANCE | Admitting: Family Medicine

## 2016-03-30 ENCOUNTER — Encounter: Payer: Self-pay | Admitting: Family Medicine

## 2016-03-30 VITALS — BP 141/90 | HR 90 | Wt 284.0 lb

## 2016-03-30 DIAGNOSIS — E669 Obesity, unspecified: Secondary | ICD-10-CM | POA: Diagnosis not present

## 2016-03-30 DIAGNOSIS — I10 Essential (primary) hypertension: Secondary | ICD-10-CM | POA: Diagnosis not present

## 2016-03-30 MED ORDER — PHENTERMINE HCL 37.5 MG PO TABS: 37.5000 mg | ORAL_TABLET | Freq: Every day | ORAL | Status: DC

## 2016-03-30 NOTE — Progress Notes (Signed)
CC: Flossie DibbleMarlon Murphy is a 45 y.o. male is here for Weight Check   Subjective: HPI:  Comparison her refill on phentermine. He believes is helping with weight loss. He denies any anxiety, irregular heartbeat, chest pain, nor sleep disturbance. He's been working out by going to the gym and doing more cardio less strength training.  Follow-up essential hypertension: No outside blood pressures reported. No chest pain shortness of breath orthopnea nor peripheral edema. He is not watching his salt intake.   Review Of Systems Outlined In HPI  Past Medical History  Diagnosis Date  . Hypertension     No past surgical history on file. Family History  Problem Relation Age of Onset  . Hypertension Mother     Social History   Social History  . Marital Status: Married    Spouse Name: N/A  . Number of Children: N/A  . Years of Education: N/A   Occupational History  . Not on file.   Social History Main Topics  . Smoking status: Never Smoker   . Smokeless tobacco: Never Used  . Alcohol Use: No  . Drug Use: No  . Sexual Activity: Yes   Other Topics Concern  . Not on file   Social History Narrative     Objective: BP 141/90 mmHg  Pulse 90  Wt 284 lb (128.822 kg)  General: Alert and Oriented, No Acute Distress HEENT: Pupils equal, round, reactive to light. Conjunctivae clear.  Moist mucous membranes Lungs: Clear to auscultation bilaterally, no wheezing/ronchi/rales.  Comfortable work of breathing. Good air movement. Cardiac: Regular rate and rhythm. Normal S1/S2.  No murmurs, rubs, nor gallops.   Extremities: No peripheral edema.  Strong peripheral pulses.  Mental Status: No depression, anxiety, nor agitation. Skin: Warm and dry.  Assessment & Plan: Cameron AlimentMarlon was seen today for weight check.  Diagnoses and all orders for this visit:  Essential hypertension  Obesity (BMI 30-39.9)  Other orders -     phentermine (ADIPEX-P) 37.5 MG tablet; Take 1 tablet (37.5 mg total) by mouth  daily before breakfast.   Obesity is improving one month refill for phentermine continue cardiovascular exercises. Essential hypertension: Uncontrolled chronic condition he's got a large room for improvement with sodium restrictions, I have encouraged him to strive for less than 2000mg  of sodium on a daily basis.  Return in about 4 weeks (around 04/27/2016).

## 2016-04-21 ENCOUNTER — Other Ambulatory Visit: Payer: Self-pay | Admitting: Family Medicine

## 2016-06-19 ENCOUNTER — Encounter: Payer: Self-pay | Admitting: Family Medicine

## 2016-06-19 ENCOUNTER — Ambulatory Visit (INDEPENDENT_AMBULATORY_CARE_PROVIDER_SITE_OTHER): Payer: PRIVATE HEALTH INSURANCE | Admitting: Family Medicine

## 2016-06-19 DIAGNOSIS — Z0289 Encounter for other administrative examinations: Secondary | ICD-10-CM

## 2016-06-19 NOTE — Progress Notes (Signed)
No show 06/19/2016  

## 2016-06-22 ENCOUNTER — Encounter: Payer: Self-pay | Admitting: Family Medicine

## 2016-06-22 ENCOUNTER — Ambulatory Visit (INDEPENDENT_AMBULATORY_CARE_PROVIDER_SITE_OTHER): Payer: PRIVATE HEALTH INSURANCE | Admitting: Family Medicine

## 2016-06-22 VITALS — BP 123/76 | HR 93 | Wt 284.0 lb

## 2016-06-22 DIAGNOSIS — E038 Other specified hypothyroidism: Secondary | ICD-10-CM

## 2016-06-22 DIAGNOSIS — E291 Testicular hypofunction: Secondary | ICD-10-CM | POA: Diagnosis not present

## 2016-06-22 DIAGNOSIS — I10 Essential (primary) hypertension: Secondary | ICD-10-CM | POA: Diagnosis not present

## 2016-06-22 MED ORDER — PHENTERMINE HCL 37.5 MG PO TABS: 37.5000 mg | ORAL_TABLET | Freq: Every day | ORAL | Status: AC
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
## 2016-06-23 LAB — TESTOSTERONE: Testosterone: 165 ng/dL — ABNORMAL LOW (ref 250–827)

## 2016-06-23 LAB — TSH: TSH: 3.1 m[IU]/L (ref 0.40–4.50)

## 2016-06-25 ENCOUNTER — Telehealth: Payer: Self-pay | Admitting: Family Medicine

## 2016-06-25 MED ORDER — TESTOSTERONE 4 MG/24HR TD PT24: 1.0000 | MEDICATED_PATCH | Freq: Every day | TRANSDERMAL | 1 refills | Status: AC

## 2016-06-25 NOTE — Telephone Encounter (Signed)
Will you please let patient know that his testosterone level remains in the deficient range. He is eligible for testosterone replacement therapy. Please let me know if he is more interested in getting daily patches, daily topical gels or an injection every other week.

## 2016-06-25 NOTE — Telephone Encounter (Signed)
[place], Rx placed in in-box ready for pickup/faxing.  

## 2016-06-25 NOTE — Telephone Encounter (Signed)
Pt notified and will like to try the patches.

## 2016-06-26 NOTE — Telephone Encounter (Signed)
Rx faxed

## 2016-08-27 ENCOUNTER — Ambulatory Visit: Payer: PRIVATE HEALTH INSURANCE | Admitting: Family Medicine

## 2016-12-18 ENCOUNTER — Other Ambulatory Visit: Payer: Self-pay

## 2016-12-18 MED ORDER — LEVOTHYROXINE SODIUM 75 MCG PO TABS: ORAL_TABLET | ORAL | 0 refills | Status: AC
